# Patient Record
Sex: Male | Born: 1950 | Race: White | Hispanic: No | Marital: Married | Smoking: Former smoker
Health system: Southern US, Community
[De-identification: ages and names within clinical notes are randomized; demographics above are authoritative.]

## PROBLEM LIST (undated history)

## (undated) DIAGNOSIS — I10 Essential (primary) hypertension: Secondary | ICD-10-CM

## (undated) DIAGNOSIS — E785 Hyperlipidemia, unspecified: Secondary | ICD-10-CM

## (undated) DIAGNOSIS — R002 Palpitations: Secondary | ICD-10-CM

## (undated) HISTORY — DX: Palpitations: R00.2

## (undated) HISTORY — DX: Essential (primary) hypertension: I10

## (undated) HISTORY — DX: Hyperlipidemia, unspecified: E78.5

---

## 2013-12-01 ENCOUNTER — Encounter: Payer: Self-pay | Admitting: Internal Medicine

## 2013-12-01 ENCOUNTER — Ambulatory Visit (INDEPENDENT_AMBULATORY_CARE_PROVIDER_SITE_OTHER): Payer: BC Managed Care – PPO | Admitting: Internal Medicine

## 2013-12-01 VITALS — BP 138/78 | HR 74 | Ht 73.0 in | Wt 242.1 lb

## 2013-12-01 DIAGNOSIS — R9431 Abnormal electrocardiogram [ECG] [EKG]: Secondary | ICD-10-CM

## 2013-12-01 DIAGNOSIS — R002 Palpitations: Secondary | ICD-10-CM | POA: Insufficient documentation

## 2013-12-01 DIAGNOSIS — E785 Hyperlipidemia, unspecified: Secondary | ICD-10-CM

## 2013-12-01 DIAGNOSIS — E782 Mixed hyperlipidemia: Secondary | ICD-10-CM | POA: Insufficient documentation

## 2013-12-01 DIAGNOSIS — I10 Essential (primary) hypertension: Secondary | ICD-10-CM | POA: Insufficient documentation

## 2013-12-01 DIAGNOSIS — Z8249 Family history of ischemic heart disease and other diseases of the circulatory system: Secondary | ICD-10-CM | POA: Insufficient documentation

## 2013-12-01 DIAGNOSIS — R0609 Other forms of dyspnea: Secondary | ICD-10-CM

## 2013-12-01 NOTE — Patient Instructions (Signed)
Your physician has recommended that you have a sleep study. This test records several body functions during sleep, including: brain activity, eye movement, oxygen and carbon dioxide blood levels, heart rate and rhythm, breathing rate and rhythm, the flow of air through your mouth and nose, snoring, body muscle movements, and chest and belly movement.  Dr. Rennis Golden has ordered a coronary calcium score - this test is done at [facility].   Your physician recommends that you schedule a follow-up appointment after your tests - in a few weeks.

## 2013-12-01 NOTE — Progress Notes (Signed)
OFFICE NOTE  Chief Complaint:  Palpitations  Primary Care Physician: Paulino Rily, MD  HPI:  [name] West is a very pleasant 62 year old male who's been followed by Dr. Yetta Barre. He recently has been having more frequent palpitations and is interested in a cardiac visit to evaluate these as well as his cardiac risk. He tells me that he was initially diagnosed with abnormally high cholesterol in the 1990s. He was then started on Lipitor and has stayed on that since then. A few years ago he is noted to have onset of hypertension and has had some weight gain. He used to be an avid runner, doing about 4 miles 3 times a week. He did then developed the problems and is not exercising as much. He has had some weight gain, particularly around the neck. His wife, who is a Designer, jewellery, noted that he does snore and may actually sleep in different sides of the house. She reports that he has had some witnessed apneic events in the past, but he is never had a sleep study. With regards to his palpitations, he reports feelings of skipped beats which were interpreted as possible PVCs. While he may be having this, an EKG in the office today did catch a short run of PAT which was 3 beats. I suspect this could be the cause of his palpitations. He is not on a beta blocker. Finally his family history is significant for heart disease both in his father and grandfather. He is asymptomatic, denying any chest pain or shortness of breath with exertion.  PMHx:  Past Medical History  Diagnosis Date  . Hyperlipidemia   . Hypertension   . Palpitations     History reviewed. No pertinent past surgical history.  FAMHx:  Family History  Problem Relation Age of Onset  . Cancer Mother   . Heart disease Father     MI at 10, CABG at 38, pacemaker at 68  . Heart attack Paternal Grandfather 84  . Heart failure Maternal Grandfather 63    SOCHx:   reports that he quit smoking about 40 years ago. He has never  used smokeless tobacco. He reports that he drinks about 1.0 ounces of alcohol per week. He reports that he does not use illicit drugs.  ALLERGIES:  No Known Allergies  ROS: A comprehensive review of systems was negative except for: Cardiovascular: positive for palpitations  HOME MEDS: Current Outpatient Prescriptions  Medication Sig Dispense Refill  . atorvastatin (LIPITOR) 40 MG tablet Take 40 mg by mouth daily.      . Cholecalciferol (VITAMIN D-3) 1000 UNITS CAPS Take 1 capsule by mouth daily.      Marland Kitchen lisinopril-hydrochlorothiazide (PRINZIDE,ZESTORETIC) 20-12.5 MG per tablet Take 1 tablet by mouth daily.      . Multiple Vitamin (MULTIVITAMIN) capsule Take 1 capsule by mouth daily. With A, C, E      . Omega-3 Fatty Acids (FISH OIL) 1200 MG CAPS Take 1 capsule by mouth daily.       No current facility-administered medications for this visit.    LABS/IMAGING: No results found for this or any previous visit (from the past 48 hour(s)). No results found.  VITALS: BP 138/78  Pulse 74  Ht 6\' 1"  (1.854 m)  Wt 242 lb 1.6 oz (109.816 kg)  BMI 31.95 kg/m2  EXAM: General appearance: alert and no distress Neck: no carotid bruit and no JVD Lungs: clear to auscultation bilaterally Heart: regular rate and rhythm, S1, S2 normal, no  murmur, click, rub or gallop Abdomen: soft, non-tender; bowel sounds normal; no masses,  no organomegaly Extremities: extremities normal, atraumatic, no cyanosis or edema Pulses: 2+ and symmetric Skin: Skin color, texture, turgor normal. No rashes or lesions Neurologic: Grossly normal Psych: Mood, affect normal  EKG: Sinus rhythm with a short run of PAT  ASSESSMENT: 1. Palpitations-PAT was noted 2. Hyperlipidemia 3. Hypertension 4. Family history of premature coronary disease  PLAN: 1.   Mr. See has been having palpitations and we were fortunate enough to capture a short run of PAT on the monitor today. There is no evidence for atrial fibrillation,  and it is possible he could have PVCs or other arrhythmias at other times. At this point he is not particularly bothered by the palpitations clearly not enough to take medication for it. I do think it's a good idea for some type of a cardiovascular assessment given his strong family history of heart disease and long-standing dyslipidemia. I would recommend a coronary calcium score which could be helpful in risk stratification and if abnormal, could indicate the need to lower his cholesterol additionally. His most recent LDL cholesterol was 103, but goal LDL may be less than 70. His blood pressures been generally well controlled although slightly higher today. Finally there is a question of sleeping which is difficult sometimes for him including some snoring which has been noted by his wife. He does have body habitus risk for sleep apnea. This could be causing his palpitations. I would recommend a sleep study. Plan to see him back to discuss the studies in a few weeks. As mentioned diffuse coronary calcium score is high, we may need to intensify his statin therapy further and I would considering adding low-dose beta blocker which is indicated in patients with known coronary artery disease, and especially with palpitations and/or tachycardia arrhythmias.  Thank you again for allowing me to participate in his care.  Chrystie Nose, MD, [facility] Attending Cardiologist [facility]  Kairon Shock C 12/01/2013, 4:26 PM

## 2013-12-02 ENCOUNTER — Other Ambulatory Visit: Payer: Self-pay | Admitting: Internal Medicine

## 2013-12-02 ENCOUNTER — Encounter: Payer: Self-pay | Admitting: Internal Medicine

## 2013-12-07 ENCOUNTER — Ambulatory Visit (INDEPENDENT_AMBULATORY_CARE_PROVIDER_SITE_OTHER)
Admission: RE | Admit: 2013-12-07 | Discharge: 2013-12-07 | Disposition: A | Discharge status: Home / Self Care | Payer: Self-pay | Source: Ambulatory Visit | Attending: Internal Medicine | Admitting: Internal Medicine

## 2013-12-07 DIAGNOSIS — Z8249 Family history of ischemic heart disease and other diseases of the circulatory system: Secondary | ICD-10-CM

## 2013-12-07 DIAGNOSIS — E785 Hyperlipidemia, unspecified: Secondary | ICD-10-CM

## 2013-12-07 DIAGNOSIS — R9431 Abnormal electrocardiogram [ECG] [EKG]: Secondary | ICD-10-CM

## 2013-12-29 ENCOUNTER — Ambulatory Visit (INDEPENDENT_AMBULATORY_CARE_PROVIDER_SITE_OTHER): Payer: BC Managed Care – PPO | Admitting: Internal Medicine

## 2013-12-29 ENCOUNTER — Encounter: Payer: Self-pay | Admitting: Internal Medicine

## 2013-12-29 VITALS — BP 138/74 | HR 60 | Ht 73.0 in | Wt 229.6 lb

## 2013-12-29 DIAGNOSIS — I4949 Other premature depolarization: Secondary | ICD-10-CM

## 2013-12-29 DIAGNOSIS — Z8249 Family history of ischemic heart disease and other diseases of the circulatory system: Secondary | ICD-10-CM

## 2013-12-29 DIAGNOSIS — I493 Ventricular premature depolarization: Secondary | ICD-10-CM | POA: Insufficient documentation

## 2013-12-29 DIAGNOSIS — R9389 Abnormal findings on diagnostic imaging of other specified body structures: Secondary | ICD-10-CM

## 2013-12-29 DIAGNOSIS — G4730 Sleep apnea, unspecified: Secondary | ICD-10-CM

## 2013-12-29 DIAGNOSIS — R002 Palpitations: Secondary | ICD-10-CM

## 2013-12-29 DIAGNOSIS — R931 Abnormal findings on diagnostic imaging of heart and coronary circulation: Secondary | ICD-10-CM | POA: Insufficient documentation

## 2013-12-29 DIAGNOSIS — I10 Essential (primary) hypertension: Secondary | ICD-10-CM

## 2013-12-29 DIAGNOSIS — E785 Hyperlipidemia, unspecified: Secondary | ICD-10-CM

## 2013-12-29 MED ORDER — ZOLPIDEM TARTRATE 10 MG PO TABS: 10.0000 mg | ORAL_TABLET | Freq: Every evening | ORAL | Status: DC | PRN

## 2013-12-29 MED ORDER — METOPROLOL SUCCINATE ER 25 MG PO TB24: 25.0000 mg | ORAL_TABLET | Freq: Every day | ORAL | Status: DC

## 2013-12-29 NOTE — Patient Instructions (Signed)
Your physician has requested that you have en exercise stress myoview. For further information please visit [email or web address]. Please follow instruction sheet, as given.  Please schedule a CPAP titration at [place] 25mg  once daily START aspirin 81mg  daily.  Your physician recommends that you schedule a follow-up appointment in 1 month.

## 2013-12-29 NOTE — Progress Notes (Signed)
OFFICE NOTE  Chief Complaint:  Palpitations  Primary Care Physician: Andria Frames, MD  HPI:  [name] West is a very pleasant 63 year old male who's been followed by Dr. Ronnald Ramp. He recently has been having more frequent palpitations and is interested in a cardiac visit to evaluate these as well as his cardiac risk. He tells me that he was initially diagnosed with abnormally high cholesterol in the 1990s. He was then started on Lipitor and has stayed on that since then. A few years ago he is noted to have onset of hypertension and has had some weight gain. He used to be an avid runner, doing about 4 miles 3 times a week. He did then developed the problems and is not exercising as much. He has had some weight gain, particularly around the neck. His wife, who is a Equities trader, noted that he does snore and may actually sleep in different sides of the house. She reports that he has had some witnessed apneic events in the past, but he is never had a sleep study. With regards to his palpitations, he reports feelings of skipped beats which were interpreted as possible PVCs. While he may be having this, an EKG in the office today did catch a short run of PAT which was 3 beats. I suspect this could be the cause of his palpitations. He is not on a beta blocker. Finally his family history is significant for heart disease both in his father and grandfather. He is asymptomatic, denying any chest pain or shortness of breath with exertion.  [name] West returns today for followup on his cardiac calcium score. The findings indicated a coronary calcium score of 313, which is 93% compared to age and sex-matched controls. He is also here to followup on his sleep study which she underwent on 12/10/2013. This demonstrated severe obstructive sleep apnea. During the total sleep time of 2 hours and 15 minutes, there were 55.6 AHI.  CPAP titration was recommended.  PMHx:  Past Medical History  Diagnosis Date   . Hyperlipidemia   . Hypertension   . Palpitations     History reviewed. No pertinent past surgical history.  FAMHx:  Family History  Problem Relation Age of Onset  . Cancer Mother   . Heart disease Father     MI at 33, CABG at 1, pacemaker at 3  . Heart attack Paternal Grandfather 29  . Heart failure Maternal Grandfather 54    SOCHx:   reports that he quit smoking about 40 years ago. He has never used smokeless tobacco. He reports that he drinks about 1.0 ounces of alcohol per week. He reports that he does not use illicit drugs.  ALLERGIES:  No Known Allergies  ROS: A comprehensive review of systems was negative except for: Cardiovascular: positive for palpitations  HOME MEDS: Current Outpatient Prescriptions  Medication Sig Dispense Refill  . aspirin 81 MG tablet Take 81 mg by mouth daily.      Marland Kitchen atorvastatin (LIPITOR) 40 MG tablet Take 40 mg by mouth daily.      . Cholecalciferol (VITAMIN D-3) 1000 UNITS CAPS Take 1 capsule by mouth daily.      Marland Kitchen lisinopril-hydrochlorothiazide (PRINZIDE,ZESTORETIC) 20-12.5 MG per tablet Take 1 tablet by mouth daily.      . Multiple Vitamin (MULTIVITAMIN) capsule Take 1 capsule by mouth daily. With A, C, E      . Omega-3 Fatty Acids (FISH OIL) 1200 MG CAPS Take 1 capsule by mouth daily.      Marland Kitchen  metoprolol succinate (TOPROL-XL) 25 MG 24 hr tablet Take 1 tablet (25 mg total) by mouth daily.  30 tablet  6  . zolpidem (AMBIEN) 10 MG tablet Take 1 tablet (10 mg total) by mouth at bedtime as needed for sleep.  1 tablet  0   No current facility-administered medications for this visit.    LABS/IMAGING: No results found for this or any previous visit (from the past 48 hour(s)). No results found.  VITALS: BP 138/74  Pulse 60  Ht 6\' 1"  (1.854 m)  Wt 229 lb 9.6 oz (104.146 kg)  BMI 30.30 kg/m2  EXAM: deferred  EKG: Sinus rhythm with frequent PVC's, in a trigemenal pattern  ASSESSMENT: 1. Palpitations-PAT and PVC's in trigemeny have  been noted 2. Hyperlipidemia 3. Hypertension 4. Family history of premature coronary disease 5. Abnormal coronary calcium score of 313  PLAN: 1.   [name] West continues to have palpitations and is now noted to have PVCs in a trigeminal pattern. He's coronary calcium score is elevated although in an indeterminate range. Nevertheless he has coronary artery disease by definition and will require increased treatment. I recommended starting aspirin 81 mg daily. For his palpitations, although hasn't start Toprol-XL 25 mg daily.  In addition, I would recommend an exercise nuclear stress test to evaluate for the significance of his coronary calcium. It is possible he could have obstructive coronary disease leading to his arrhythmias.  Finally, his sleep study is significant for a structure sleep apnea which is severe. We have arranged for followup CPAP titration and provided a prescription for Ambien to take as needed to get rest during the study.  Followup will be in one month to go over the stress test results and see if he's had improvement in his palpitations and sleep.  Thank you again for allowing me to participate in his care.  Pixie Casino, MD, [facility] Attending Cardiologist [facility]  Tomma Ehinger C 12/29/2013, 5:40 PM

## 2014-01-05 ENCOUNTER — Ambulatory Visit (HOSPITAL_COMMUNITY)
Admission: RE | Admit: 2014-01-05 | Discharge: 2014-01-05 | Disposition: A | Discharge status: Home / Self Care | Payer: BC Managed Care – PPO | Source: Ambulatory Visit | Attending: Cardiovascular Disease | Admitting: Cardiovascular Disease

## 2014-01-05 DIAGNOSIS — R0989 Other specified symptoms and signs involving the circulatory and respiratory systems: Secondary | ICD-10-CM

## 2014-01-05 DIAGNOSIS — I4949 Other premature depolarization: Secondary | ICD-10-CM | POA: Insufficient documentation

## 2014-01-05 DIAGNOSIS — R931 Abnormal findings on diagnostic imaging of heart and coronary circulation: Secondary | ICD-10-CM

## 2014-01-05 DIAGNOSIS — R0609 Other forms of dyspnea: Secondary | ICD-10-CM

## 2014-01-05 DIAGNOSIS — I493 Ventricular premature depolarization: Secondary | ICD-10-CM

## 2014-01-05 DIAGNOSIS — R9431 Abnormal electrocardiogram [ECG] [EKG]: Secondary | ICD-10-CM

## 2014-01-05 MED ORDER — TECHNETIUM TC 99M SESTAMIBI GENERIC - CARDIOLITE
10.0000 | Freq: Once | INTRAVENOUS | Status: AC | PRN
  Administered 2014-01-05: 10 via INTRAVENOUS

## 2014-01-05 MED ORDER — TECHNETIUM TC 99M SESTAMIBI GENERIC - CARDIOLITE
30.0000 | Freq: Once | INTRAVENOUS | Status: AC | PRN
  Administered 2014-01-05: 30 via INTRAVENOUS

## 2014-01-05 NOTE — Procedures (Addendum)
[facility] [address] [place] [address] [phone]  Cardiology Nuclear Med Study  [name] West is a 63 y.o. male     MRN : [id number]     DOB: 1951/05/14  Procedure Date: 01/05/2014  Nuclear Med Background Indication for Stress Test:  Abnormal EKG History:  Abnormal coronary calcium score 313 Cardiac Risk Factors: Family History - CAD, History of Smoking, Hypertension, Lipids, Overweight and PVC's;PAT's;Tigiminal pattern  Symptoms:  DOE and Palpitations   Nuclear Pre-Procedure Caffeine/Decaff Intake:  1:00am NPO After: 11am   IV Site: R Hand  IV 0.9% NS with Angio Cath:  22g  Chest Size (in):  46"  IV Started by: Azucena Cecil, RN  Height: 6\' 1"  (1.854 m)  Cup Size: n/a  BMI:  Body mass index is 30.22 kg/(m^2). Weight:  229 lb (103.874 kg)   Tech Comments:  n/a    Nuclear Med Study 1 or 2 day study: 1 day  Stress Test Type:  Stress  Order Authorizing Provider:  Lyman Bishop, MD   Resting Radionuclide: Technetium 60m Sestamibi  Resting Radionuclide Dose: 10.1 mCi   Stress Radionuclide:  Technetium 48m Sestamibi  Stress Radionuclide Dose: 30.9 mCi           Stress Protocol Rest HR: 59 Stress HR: 139  Rest BP:130/78 Stress BP: 157/70  Exercise Time (min): 9:01 METS: 10.10   Predicted Max HR: 158 bpm % Max HR: 87.97 bpm Rate Pressure Product: 21823  Dose of Adenosine (mg):  n/a Dose of Lexiscan: n/a mg  Dose of Atropine (mg): n/a Dose of Dobutamine: n/a mcg/kg/min (at max HR)  Stress Test Technologist: Mellody Memos, CCT Nuclear Technologist: Otho Perl, CNMT   Rest Procedure:  Myocardial perfusion imaging was performed at rest 45 minutes following the intravenous administration of Technetium 73m Sestamibi. Stress Procedure:  The patient performed treadmill exercise using a Bruce  Protocol for 9 minutes and 1 second. The patient stopped due to chest pain, shortness of breath and fatigue. Patient  complained chest pain.  There were significant ST-T wave changes.  Technetium 15m Sestamibi was injected at peak exercise and myocardial perfusion imaging was performed after a brief delay.  Transient Ischemic Dilatation (Normal <1.22):  1.09 Lung/Heart Ratio (Normal <0.45):  0.10 QGS EDV:  122 ml QGS ESV:  66 ml LV Ejection Fraction: 46%    Rest ECG: NSR - Normal EKG  Stress ECG: Significant 1 - 2 mm of downsloping ST abnormalities consistent with ischemia with frequent ventricular ectopy and transient bigeminy.  QPS Raw Data Images:  Normal; no motion artifact; normal heart/lung ratio. Stress Images:  There is decreased uptake in the apical inferiori inferolateral wall and basal lateral and inferior segment. Rest Images:  There is decreased uptake apically and diaphragmatic attenuation in the inferior wall. Subtraction (SDS):  Focal apical scar with peri-infarction ischemia and ischemia in the basal lateral segment.  Impression Exercise Capacity:  Good exercise capacity. BP Response:  Hypotensive blood pressure response with BP decreasing to 115/67 immediately following stress. Clinical Symptoms:  Mild chest pain/dyspnea. ECG Impression:  Significant ST abnormalities consistent with ischemia. Comparison with Prior Nuclear Study: No images to compare  Overall Impression:  High risk stress nuclear study demonstrating exercise induced chest pain, ECG changes and ectopy with scintigraphic evidence for apical scar with peri-infarct ischemia and ischemia involving the basal lateral wall. Clinical correlation is recommended.  LV Wall Motion:  Mild LV dysfunction,EF 46% with apical hypocontractility.  Troy Sine, MD  01/05/2014 6:12 PM

## 2014-01-26 ENCOUNTER — Ambulatory Visit (INDEPENDENT_AMBULATORY_CARE_PROVIDER_SITE_OTHER): Payer: BC Managed Care – PPO | Admitting: Internal Medicine

## 2014-01-26 ENCOUNTER — Encounter: Payer: Self-pay | Admitting: Internal Medicine

## 2014-01-26 VITALS — BP 108/64 | HR 60 | Ht 73.0 in | Wt 231.3 lb

## 2014-01-26 DIAGNOSIS — R002 Palpitations: Secondary | ICD-10-CM

## 2014-01-26 DIAGNOSIS — R9389 Abnormal findings on diagnostic imaging of other specified body structures: Secondary | ICD-10-CM

## 2014-01-26 DIAGNOSIS — D689 Coagulation defect, unspecified: Secondary | ICD-10-CM

## 2014-01-26 DIAGNOSIS — I4949 Other premature depolarization: Secondary | ICD-10-CM

## 2014-01-26 DIAGNOSIS — R5381 Other malaise: Secondary | ICD-10-CM

## 2014-01-26 DIAGNOSIS — R5383 Other fatigue: Secondary | ICD-10-CM

## 2014-01-26 DIAGNOSIS — I10 Essential (primary) hypertension: Secondary | ICD-10-CM

## 2014-01-26 DIAGNOSIS — I493 Ventricular premature depolarization: Secondary | ICD-10-CM

## 2014-01-26 DIAGNOSIS — R9439 Abnormal result of other cardiovascular function study: Secondary | ICD-10-CM | POA: Insufficient documentation

## 2014-01-26 DIAGNOSIS — Z01818 Encounter for other preprocedural examination: Secondary | ICD-10-CM

## 2014-01-26 DIAGNOSIS — R931 Abnormal findings on diagnostic imaging of heart and coronary circulation: Secondary | ICD-10-CM

## 2014-01-26 DIAGNOSIS — Z8249 Family history of ischemic heart disease and other diseases of the circulatory system: Secondary | ICD-10-CM

## 2014-01-26 NOTE — Progress Notes (Signed)
OFFICE NOTE  Chief Complaint:  Palpitations  Primary Care Physician: Andria Frames, MD  HPI:  [name] West is a very pleasant 63 year old male who's been followed by Dr. Ronnald Ramp. He recently has been having more frequent palpitations and is interested in a cardiac visit to evaluate these as well as his cardiac risk. He tells me that he was initially diagnosed with abnormally high cholesterol in the 1990s. He was then started on Lipitor and has stayed on that since then. A few years ago he is noted to have onset of hypertension and has had some weight gain. He used to be an avid runner, doing about 4 miles 3 times a week. He did then developed the problems and is not exercising as much. He has had some weight gain, particularly around the neck. His wife, who is a Equities trader, noted that he does snore and may actually sleep in different sides of the house. She reports that he has had some witnessed apneic events in the past, but he is never had a sleep study. With regards to his palpitations, he reports feelings of skipped beats which were interpreted as possible PVCs. While he may be having this, an EKG in the office today did catch a short run of PAT which was 3 beats. I suspect this could be the cause of his palpitations. He is not on a beta blocker. Finally his family history is significant for heart disease both in his father and grandfather. He is asymptomatic, denying any chest pain or shortness of breath with exertion.  [name] West returns today for followup on his cardiac calcium score. The findings indicated a coronary calcium score of 313, which is 93% compared to age and sex-matched controls. He is also here to followup on his sleep study which she underwent on 12/10/2013. This demonstrated severe obstructive sleep apnea. During the total sleep time of 2 hours and 15 minutes, there were 55.6 AHI.  CPAP titration was recommended.  Based upon his frequent PVCs which are  persistent, recommended a nuclear stress test as there is evidence of multivessel coronary calcium. He underwent a nuclear stress test 01/05/2014. This was an exercise nuclear stress test. He exercised for 9 minutes and achieved 10 metabolic equivalents. Exercise was discontinued due to chest pain, shortness of breath and fatigue. There was 2 mm of horizontal ST segment depression in frequent ventricular ectopy and transient bigeminy noted. The nuclear perfusion images were interpreted as high wrist demonstrating an apical scar with peri-infarct ischemia and basal lateral ischemia.  EF was 46%.  I share the results with Mr. Husby today and am concerned about significant coronary disease. He is quite surprised by these findings as he reports that he feels fairly well and continues to deny any chest pain. He does report some mild fatigue and decreased exercise tolerance compared to what he can do with running and exercise several years ago.  PMHx:  Past Medical History  Diagnosis Date  . Hyperlipidemia   . Hypertension   . Palpitations     History reviewed. No pertinent past surgical history.  FAMHx:  Family History  Problem Relation Age of Onset  . Cancer Mother   . Heart disease Father     MI at 68, CABG at 12, pacemaker at 67  . Heart attack Paternal Grandfather 23  . Heart failure Maternal Grandfather 37    SOCHx:   reports that he quit smoking about 40 years ago. He has never  used smokeless tobacco. He reports that he drinks about 1.0 ounces of alcohol per week. He reports that he does not use illicit drugs.  ALLERGIES:  No Known Allergies  ROS: A comprehensive review of systems was negative except for: Cardiovascular: positive for palpitations  HOME MEDS: Current Outpatient Prescriptions  Medication Sig Dispense Refill  . aspirin 81 MG tablet Take 81 mg by mouth daily.      Marland Kitchen atorvastatin (LIPITOR) 40 MG tablet Take 40 mg by mouth daily.      . Cholecalciferol (VITAMIN  D-3) 1000 UNITS CAPS Take 1 capsule by mouth daily.      Marland Kitchen lisinopril-hydrochlorothiazide (PRINZIDE,ZESTORETIC) 20-12.5 MG per tablet Take 1 tablet by mouth daily.      . metoprolol succinate (TOPROL-XL) 25 MG 24 hr tablet Take 1 tablet (25 mg total) by mouth daily.  30 tablet  6  . Multiple Vitamin (MULTIVITAMIN) capsule Take 1 capsule by mouth daily. With A, C, E      . Omega-3 Fatty Acids (FISH OIL) 1200 MG CAPS Take 1 capsule by mouth daily.      Marland Kitchen zolpidem (AMBIEN) 10 MG tablet Take 1 tablet (10 mg total) by mouth at bedtime as needed for sleep.  1 tablet  0   No current facility-administered medications for this visit.    LABS/IMAGING: No results found for this or any previous visit (from the past 48 hour(s)). No results found.  VITALS: BP 108/64  Pulse 60  Ht 6\' 1"  (1.854 m)  Wt 231 lb 4.8 oz (104.917 kg)  BMI 30.52 kg/m2  EXAM: deferred  EKG: Sinus rhythm with frequent PVC's, in a trigemenal pattern  ASSESSMENT: 1. Palpitations-PAT and PVC's in trigemeny have been noted, minimal improvement with b-blocker 2. Hyperlipidemia 3. Hypertension 4. Family history of premature coronary disease 5. Abnormal coronary calcium score of 313 6. High risk nuclear stress test with a partially reversible inferior apical and lateral defect, EF 46%  PLAN: 1.   Mr. 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 He's coronary calcium score is elevated although in an indeterminate range. His nuclear stress test was abnormal and he developed chest pain with exercise as well as ST depression and perfusion images which indicate a partially reversible defect with a reduced EF to 46% . Based on these findings I would recommend cardiac catheterization. I discussed the risks and benefits with him and he is agreeable to proceed.  I will be in contact with the results of his cardiac catheterization within the next few weeks. Thank you again for allowing  me to participate in his care.  Pixie Casino, MD, [facility] Attending Cardiologist [facility]  HILTY,Kenneth C 01/26/2014, 3:29 PM

## 2014-01-26 NOTE — Patient Instructions (Addendum)
Your physician has requested that you have a cardiac catheterization. Cardiac catheterization is used to diagnose and/or treat various heart conditions. Doctors may recommend this procedure for a number of different reasons. The most common reason is to evaluate chest pain. Chest pain can be a symptom of coronary artery disease (CAD), and cardiac catheterization can show whether plaque is narrowing or blocking your heart's arteries. This procedure is also used to evaluate the valves, as well as measure the blood flow and oxygen levels in different parts of your heart. For further information please visit [email or web address]. Please follow instruction sheet, as given.  You will need to have blood work & a chest x-ray 3-5 days prior to this procedure.  Please go to [phone] do not need an appointment.

## 2014-01-28 ENCOUNTER — Telehealth: Payer: Self-pay | Admitting: Cardiology

## 2014-01-28 ENCOUNTER — Encounter: Payer: Self-pay | Admitting: Cardiology

## 2014-01-28 NOTE — Telephone Encounter (Signed)
Returned call and pt verified x 2.  Pt stated he had to have his cath rescheduled and it will be w/ Dr. Ellyn Hack.  Stated he has never met him and wanted to have a 5 min conversation w/ him about his views on stents and angioplasty.  Stated he wants to know if he has ever done a cath before, which he's sure he has, but wants to hear it from him directly.  Pt informed Dr. Ellyn Hack and his nurse will be notified of his request.  Pt verbalized understanding and agreed w/ plan.  Message forwarded to Dr. Sharlyn Bologna, RN.  Of note, this RN did explain to pt that Dr. Ellyn Hack is not scheduled to be back in the office until next Friday for a partial day and then again on the following Tuesday before his appt.  Pt stated that is fine, as long as he calls before the procedure.

## 2014-01-28 NOTE — Telephone Encounter (Signed)
[name] West was scheduled for a cardiac cath on 02/09/14 with Dr. Debara Pickett.  The patient called and stated that he could not have the procedure done on the 18th due to a scheduling conflict.  I spoke with Dr. Debara Pickett and he stated that this patient needed to have this procedure done sooner than later.  Dr. Debara Pickett stated that one of the other physicians could do it.  I called the patient and explained this to the patient and he stated to schedule with Dr. Ellyn Hack. Procedure is scheduled for 02/10/14 @ 7:30 am-----the patient would like to speak with Dr. Ellyn Hack prior to having his cath.

## 2014-01-29 NOTE — Telephone Encounter (Signed)
Why don't we add him to the end of my 1/4 day on Friday .  Not a good conversation to do over the phone. That way, I don't have to rush over to see him on the Am of the procedure.    Leonie Man, MD

## 2014-01-31 ENCOUNTER — Ambulatory Visit
Admission: RE | Admit: 2014-01-31 | Discharge: 2014-01-31 | Disposition: A | Discharge status: Home / Self Care | Payer: BC Managed Care – PPO | Source: Ambulatory Visit | Attending: Internal Medicine | Admitting: Internal Medicine

## 2014-01-31 ENCOUNTER — Encounter (HOSPITAL_COMMUNITY): Payer: Self-pay | Admitting: Pharmacy Technician

## 2014-01-31 DIAGNOSIS — Z01818 Encounter for other preprocedural examination: Secondary | ICD-10-CM

## 2014-01-31 LAB — CBC
HCT: 41.7 % (ref 39.0–52.0)
Hemoglobin: 14.5 g/dL (ref 13.0–17.0)
MCH: 31.2 pg (ref 26.0–34.0)
MCHC: 34.8 g/dL (ref 30.0–36.0)
MCV: 89.7 fL (ref 78.0–100.0)
PLATELETS: 235 10*3/uL (ref 150–400)
RBC: 4.65 MIL/uL (ref 4.22–5.81)
RDW: 13.8 % (ref 11.5–15.5)
WBC: 6 10*3/uL (ref 4.0–10.5)

## 2014-01-31 LAB — BASIC METABOLIC PANEL
BUN: 15 mg/dL (ref 6–23)
CALCIUM: 9.1 mg/dL (ref 8.4–10.5)
CO2: 30 mEq/L (ref 19–32)
CREATININE: 0.77 mg/dL (ref 0.50–1.35)
Chloride: 102 mEq/L (ref 96–112)
Glucose, Bld: 93 mg/dL (ref 70–99)
Potassium: 4.9 mEq/L (ref 3.5–5.3)
Sodium: 139 mEq/L (ref 135–145)

## 2014-01-31 NOTE — Telephone Encounter (Signed)
[name] West - Will you contact this patient to schedule this appointment, per Dr. Ellyn Hack (below)?  Thanks. ~ [name]. [name], BSN, RN

## 2014-02-01 ENCOUNTER — Inpatient Hospital Stay (HOSPITAL_COMMUNITY): Payer: BC Managed Care – PPO

## 2014-02-01 ENCOUNTER — Other Ambulatory Visit: Payer: Self-pay | Admitting: *Deleted

## 2014-02-01 ENCOUNTER — Inpatient Hospital Stay (HOSPITAL_COMMUNITY)
Admission: RE | Admit: 2014-02-01 | Discharge: 2014-02-07 | DRG: 234 | Disposition: A | Discharge status: Home / Self Care | Payer: BC Managed Care – PPO | Source: Ambulatory Visit | Attending: Thoracic Surgery (Cardiothoracic Vascular Surgery) | Admitting: Thoracic Surgery (Cardiothoracic Vascular Surgery)

## 2014-02-01 ENCOUNTER — Ambulatory Visit (HOSPITAL_COMMUNITY): Payer: BC Managed Care – PPO

## 2014-02-01 ENCOUNTER — Encounter (HOSPITAL_COMMUNITY): Payer: Self-pay | Admitting: Emergency Medicine

## 2014-02-01 ENCOUNTER — Encounter (HOSPITAL_COMMUNITY)
Admission: RE | Disposition: A | Discharge status: Home / Self Care | Payer: Self-pay | Source: Ambulatory Visit | Attending: Thoracic Surgery (Cardiothoracic Vascular Surgery)

## 2014-02-01 DIAGNOSIS — I498 Other specified cardiac arrhythmias: Secondary | ICD-10-CM | POA: Diagnosis not present

## 2014-02-01 DIAGNOSIS — E8779 Other fluid overload: Secondary | ICD-10-CM | POA: Diagnosis not present

## 2014-02-01 DIAGNOSIS — I2510 Atherosclerotic heart disease of native coronary artery without angina pectoris: Secondary | ICD-10-CM

## 2014-02-01 DIAGNOSIS — D62 Acute posthemorrhagic anemia: Secondary | ICD-10-CM | POA: Diagnosis not present

## 2014-02-01 DIAGNOSIS — Z01818 Encounter for other preprocedural examination: Secondary | ICD-10-CM

## 2014-02-01 DIAGNOSIS — Z8249 Family history of ischemic heart disease and other diseases of the circulatory system: Secondary | ICD-10-CM

## 2014-02-01 DIAGNOSIS — I493 Ventricular premature depolarization: Secondary | ICD-10-CM

## 2014-02-01 DIAGNOSIS — R9439 Abnormal result of other cardiovascular function study: Secondary | ICD-10-CM

## 2014-02-01 DIAGNOSIS — Z87891 Personal history of nicotine dependence: Secondary | ICD-10-CM

## 2014-02-01 DIAGNOSIS — Z01810 Encounter for preprocedural cardiovascular examination: Secondary | ICD-10-CM

## 2014-02-01 DIAGNOSIS — R002 Palpitations: Secondary | ICD-10-CM

## 2014-02-01 DIAGNOSIS — Z79899 Other long term (current) drug therapy: Secondary | ICD-10-CM

## 2014-02-01 DIAGNOSIS — D72829 Elevated white blood cell count, unspecified: Secondary | ICD-10-CM | POA: Diagnosis not present

## 2014-02-01 DIAGNOSIS — I10 Essential (primary) hypertension: Secondary | ICD-10-CM

## 2014-02-01 DIAGNOSIS — R931 Abnormal findings on diagnostic imaging of heart and coronary circulation: Secondary | ICD-10-CM

## 2014-02-01 DIAGNOSIS — G4733 Obstructive sleep apnea (adult) (pediatric): Secondary | ICD-10-CM | POA: Diagnosis present

## 2014-02-01 DIAGNOSIS — E785 Hyperlipidemia, unspecified: Secondary | ICD-10-CM | POA: Diagnosis present

## 2014-02-01 DIAGNOSIS — Z7982 Long term (current) use of aspirin: Secondary | ICD-10-CM

## 2014-02-01 HISTORY — PX: LEFT HEART CATHETERIZATION WITH CORONARY ANGIOGRAM: SHX5451

## 2014-02-01 LAB — URINALYSIS, ROUTINE W REFLEX MICROSCOPIC
BILIRUBIN URINE: NEGATIVE
Glucose, UA: NEGATIVE mg/dL
KETONES UR: NEGATIVE mg/dL
Leukocytes, UA: NEGATIVE
NITRITE: NEGATIVE
PH: 7 (ref 5.0–8.0)
Protein, ur: NEGATIVE mg/dL
Specific Gravity, Urine: 1.012 (ref 1.005–1.030)
Urobilinogen, UA: 0.2 mg/dL (ref 0.0–1.0)

## 2014-02-01 LAB — SURGICAL PCR SCREEN
MRSA, PCR: NEGATIVE
Staphylococcus aureus: POSITIVE — AB

## 2014-02-01 LAB — PULMONARY FUNCTION TEST
FEF 25-75 POST: 3.56 L/s
FEF 25-75 PRE: 3 L/s
FEF2575-%Change-Post: 18 %
FEF2575-%PRED-PRE: 95 %
FEF2575-%Pred-Post: 113 %
FEV1-%Change-Post: 4 %
FEV1-%PRED-PRE: 88 %
FEV1-%Pred-Post: 92 %
FEV1-Post: 3.62 L
FEV1-Pre: 3.48 L
FEV1FVC-%Change-Post: 5 %
FEV1FVC-%Pred-Pre: 102 %
FEV6-%Change-Post: -1 %
FEV6-%PRED-POST: 88 %
FEV6-%PRED-PRE: 89 %
FEV6-PRE: 4.47 L
FEV6-Post: 4.4 L
FEV6FVC-%CHANGE-POST: 1 %
FEV6FVC-%Pred-Post: 105 %
FEV6FVC-%Pred-Pre: 104 %
FVC-%Change-Post: -1 %
FVC-%Pred-Post: 85 %
FVC-%Pred-Pre: 86 %
FVC-POST: 4.44 L
FVC-PRE: 4.52 L
POST FEV6/FVC RATIO: 100 %
PRE FEV1/FVC RATIO: 77 %
Post FEV1/FVC ratio: 81 %
Pre FEV6/FVC Ratio: 99 %

## 2014-02-01 LAB — PROTIME-INR
INR: 0.97 (ref <1.50)
Prothrombin Time: 12.8 seconds (ref 11.6–15.2)

## 2014-02-01 LAB — APTT: APTT: 30 s (ref 24–37)

## 2014-02-01 LAB — BLOOD GAS, ARTERIAL
ACID-BASE EXCESS: 0.5 mmol/L (ref 0.0–2.0)
Bicarbonate: 24.1 mEq/L — ABNORMAL HIGH (ref 20.0–24.0)
Drawn by: 312761
FIO2: 0.21 %
O2 Saturation: 96.2 %
PCO2 ART: 35.6 mmHg (ref 35.0–45.0)
Patient temperature: 98.6
TCO2: 25.2 mmol/L (ref 0–100)
pH, Arterial: 7.445 (ref 7.350–7.450)
pO2, Arterial: 78.2 mmHg — ABNORMAL LOW (ref 80.0–100.0)

## 2014-02-01 LAB — ABO/RH: ABO/RH(D): O POS

## 2014-02-01 LAB — TYPE AND SCREEN
ABO/RH(D): O POS
ANTIBODY SCREEN: NEGATIVE

## 2014-02-01 LAB — URINE MICROSCOPIC-ADD ON

## 2014-02-01 LAB — TSH: TSH: 1.955 u[IU]/mL (ref 0.350–4.500)

## 2014-02-01 SURGERY — LEFT HEART CATHETERIZATION WITH CORONARY ANGIOGRAM
Anesthesia: LOCAL

## 2014-02-01 MED ORDER — HEPARIN (PORCINE) IN NACL 2-0.9 UNIT/ML-% IJ SOLN
INTRAMUSCULAR | Status: AC
  Filled 2014-02-01: qty 1500

## 2014-02-01 MED ORDER — ADULT MULTIVITAMIN W/MINERALS CH
1.0000 | ORAL_TABLET | Freq: Every day | ORAL | Status: DC
  Filled 2014-02-01: qty 1

## 2014-02-01 MED ORDER — SODIUM CHLORIDE 0.9 % IV SOLN: 250.0000 mL | INTRAVENOUS | Status: DC | PRN

## 2014-02-01 MED ORDER — SODIUM CHLORIDE 0.9 % IV SOLN
INTRAVENOUS | Status: DC
  Filled 2014-02-01: qty 1

## 2014-02-01 MED ORDER — DEXMEDETOMIDINE HCL IN NACL 400 MCG/100ML IV SOLN
0.1000 ug/kg/h | INTRAVENOUS | Status: DC
  Filled 2014-02-01: qty 100

## 2014-02-01 MED ORDER — PHENYLEPHRINE HCL 10 MG/ML IJ SOLN
30.0000 ug/min | INTRAVENOUS | Status: DC
  Filled 2014-02-01: qty 2

## 2014-02-01 MED ORDER — HYDROCHLOROTHIAZIDE 12.5 MG PO CAPS
12.5000 mg | ORAL_CAPSULE | Freq: Every day | ORAL | Status: DC
  Administered 2014-02-01: 12.5 mg via ORAL
  Filled 2014-02-01 (×2): qty 1

## 2014-02-01 MED ORDER — BISACODYL 5 MG PO TBEC: 5.0000 mg | DELAYED_RELEASE_TABLET | Freq: Once | ORAL | Status: DC

## 2014-02-01 MED ORDER — MAGNESIUM SULFATE 50 % IJ SOLN
40.0000 meq | INTRAMUSCULAR | Status: DC
  Filled 2014-02-01: qty 10

## 2014-02-01 MED ORDER — LISINOPRIL 20 MG PO TABS
20.0000 mg | ORAL_TABLET | Freq: Every day | ORAL | Status: DC
  Administered 2014-02-01: 20 mg via ORAL
  Filled 2014-02-01 (×2): qty 1

## 2014-02-01 MED ORDER — SODIUM CHLORIDE 0.9 % IV SOLN
INTRAVENOUS | Status: DC
  Administered 2014-02-01: 10:00:00 via INTRAVENOUS

## 2014-02-01 MED ORDER — SODIUM CHLORIDE 0.9 % IJ SOLN
3.0000 mL | INTRAMUSCULAR | Status: DC | PRN
Start: 2014-02-01 — End: 2014-02-02

## 2014-02-01 MED ORDER — SODIUM CHLORIDE 0.9 % IJ SOLN
3.0000 mL | INTRAMUSCULAR | Status: DC | PRN
Start: 2014-02-01 — End: 2014-02-01

## 2014-02-01 MED ORDER — MUPIROCIN 2 % EX OINT
1.0000 "application " | TOPICAL_OINTMENT | Freq: Two times a day (BID) | CUTANEOUS | Status: AC
  Administered 2014-02-01 – 2014-02-06 (×9): 1 via NASAL
  Filled 2014-02-01 (×2): qty 22

## 2014-02-01 MED ORDER — SODIUM CHLORIDE 0.9 % IV SOLN
INTRAVENOUS | Status: DC
  Filled 2014-02-01: qty 30

## 2014-02-01 MED ORDER — VANCOMYCIN HCL 10 G IV SOLR
1500.0000 mg | INTRAVENOUS | Status: DC
  Filled 2014-02-01: qty 1500

## 2014-02-01 MED ORDER — CHLORHEXIDINE GLUCONATE CLOTH 2 % EX PADS
6.0000 | MEDICATED_PAD | Freq: Once | CUTANEOUS | Status: AC
  Administered 2014-02-02: 6 via TOPICAL

## 2014-02-01 MED ORDER — FENTANYL CITRATE 0.05 MG/ML IJ SOLN
INTRAMUSCULAR | Status: AC
  Filled 2014-02-01: qty 2

## 2014-02-01 MED ORDER — ASPIRIN 81 MG PO CHEW
81.0000 mg | CHEWABLE_TABLET | ORAL | Status: AC
  Administered 2014-02-01: 81 mg via ORAL
  Filled 2014-02-01: qty 1

## 2014-02-01 MED ORDER — NITROGLYCERIN 0.2 MG/ML ON CALL CATH LAB
INTRAVENOUS | Status: AC
Start: 2014-02-01 — End: 2014-02-01
  Filled 2014-02-01: qty 1

## 2014-02-01 MED ORDER — TEMAZEPAM 15 MG PO CAPS: 15.0000 mg | ORAL_CAPSULE | Freq: Once | ORAL | Status: AC | PRN

## 2014-02-01 MED ORDER — HEPARIN SODIUM (PORCINE) 1000 UNIT/ML IJ SOLN
INTRAMUSCULAR | Status: AC
  Filled 2014-02-01: qty 1

## 2014-02-01 MED ORDER — PLASMA-LYTE 148 IV SOLN
INTRAVENOUS | Status: DC
  Filled 2014-02-01: qty 2.5

## 2014-02-01 MED ORDER — SODIUM CHLORIDE 0.9 % IV SOLN: 1.0000 mL/kg/h | INTRAVENOUS | Status: AC

## 2014-02-01 MED ORDER — HEPARIN (PORCINE) IN NACL 100-0.45 UNIT/ML-% IJ SOLN
1250.0000 [IU]/h | INTRAMUSCULAR | Status: DC
  Administered 2014-02-01: 1250 [IU]/h via INTRAVENOUS
  Filled 2014-02-01 (×2): qty 250

## 2014-02-01 MED ORDER — ALPRAZOLAM 0.25 MG PO TABS: 0.2500 mg | ORAL_TABLET | ORAL | Status: DC | PRN

## 2014-02-01 MED ORDER — MULTIVITAMINS PO CAPS
1.0000 | ORAL_CAPSULE | Freq: Every day | ORAL | Status: DC
Start: 2014-02-01 — End: 2014-02-01

## 2014-02-01 MED ORDER — ASPIRIN 81 MG PO TABS: 81.0000 mg | ORAL_TABLET | Freq: Every day | ORAL | Status: DC

## 2014-02-01 MED ORDER — ASPIRIN EC 81 MG PO TBEC
81.0000 mg | DELAYED_RELEASE_TABLET | Freq: Every day | ORAL | Status: DC
  Filled 2014-02-01: qty 1

## 2014-02-01 MED ORDER — LISINOPRIL-HYDROCHLOROTHIAZIDE 20-12.5 MG PO TABS: 1.0000 | ORAL_TABLET | Freq: Every day | ORAL | Status: DC

## 2014-02-01 MED ORDER — CHLORHEXIDINE GLUCONATE CLOTH 2 % EX PADS
6.0000 | MEDICATED_PAD | Freq: Once | CUTANEOUS | Status: AC
  Administered 2014-02-01: 6 via TOPICAL

## 2014-02-01 MED ORDER — DEXTROSE 5 % IV SOLN
750.0000 mg | INTRAVENOUS | Status: DC
  Filled 2014-02-01 (×2): qty 750

## 2014-02-01 MED ORDER — DEXTROSE 5 % IV SOLN
1.5000 g | INTRAVENOUS | Status: DC
  Filled 2014-02-01: qty 1.5

## 2014-02-01 MED ORDER — DOPAMINE-DEXTROSE 3.2-5 MG/ML-% IV SOLN
2.0000 ug/kg/min | INTRAVENOUS | Status: DC
  Filled 2014-02-01: qty 250

## 2014-02-01 MED ORDER — LIDOCAINE HCL (PF) 1 % IJ SOLN
INTRAMUSCULAR | Status: AC
  Filled 2014-02-01: qty 30

## 2014-02-01 MED ORDER — VERAPAMIL HCL 2.5 MG/ML IV SOLN
INTRAVENOUS | Status: AC
  Filled 2014-02-01: qty 2

## 2014-02-01 MED ORDER — MIDAZOLAM HCL 2 MG/2ML IJ SOLN
INTRAMUSCULAR | Status: AC
  Filled 2014-02-01: qty 2

## 2014-02-01 MED ORDER — ONDANSETRON HCL 4 MG/2ML IJ SOLN: 4.0000 mg | Freq: Four times a day (QID) | INTRAMUSCULAR | Status: DC | PRN

## 2014-02-01 MED ORDER — POTASSIUM CHLORIDE 2 MEQ/ML IV SOLN
80.0000 meq | INTRAVENOUS | Status: DC
  Filled 2014-02-01: qty 40

## 2014-02-01 MED ORDER — NITROGLYCERIN IN D5W 200-5 MCG/ML-% IV SOLN
2.0000 ug/min | INTRAVENOUS | Status: DC
  Filled 2014-02-01: qty 250

## 2014-02-01 MED ORDER — CHLORHEXIDINE GLUCONATE CLOTH 2 % EX PADS: 6.0000 | MEDICATED_PAD | Freq: Every day | CUTANEOUS | Status: DC

## 2014-02-01 MED ORDER — ACETAMINOPHEN 325 MG PO TABS: 650.0000 mg | ORAL_TABLET | ORAL | Status: DC | PRN

## 2014-02-01 MED ORDER — EPINEPHRINE HCL 1 MG/ML IJ SOLN
0.5000 ug/min | INTRAVENOUS | Status: DC
  Filled 2014-02-01: qty 4

## 2014-02-01 MED ORDER — SODIUM CHLORIDE 0.9 % IV SOLN
INTRAVENOUS | Status: DC
  Filled 2014-02-01: qty 40

## 2014-02-01 MED ORDER — DIAZEPAM 5 MG PO TABS
5.0000 mg | ORAL_TABLET | Freq: Once | ORAL | Status: AC
  Administered 2014-02-02: 5 mg via ORAL
  Filled 2014-02-01: qty 1

## 2014-02-01 MED ORDER — ATORVASTATIN CALCIUM 40 MG PO TABS
40.0000 mg | ORAL_TABLET | Freq: Every day | ORAL | Status: DC
  Administered 2014-02-01 – 2014-02-06 (×5): 40 mg via ORAL
  Filled 2014-02-01 (×7): qty 1

## 2014-02-01 MED ORDER — ALBUTEROL SULFATE (2.5 MG/3ML) 0.083% IN NEBU
2.5000 mg | INHALATION_SOLUTION | Freq: Once | RESPIRATORY_TRACT | Status: AC
  Administered 2014-02-01: 2.5 mg via RESPIRATORY_TRACT

## 2014-02-01 MED ORDER — METOPROLOL SUCCINATE ER 25 MG PO TB24
25.0000 mg | ORAL_TABLET | Freq: Every day | ORAL | Status: DC
Start: 2014-02-01 — End: 2014-02-02
  Administered 2014-02-01: 25 mg via ORAL
  Filled 2014-02-01 (×2): qty 1

## 2014-02-01 MED ORDER — METOPROLOL TARTRATE 12.5 MG HALF TABLET
12.5000 mg | ORAL_TABLET | Freq: Once | ORAL | Status: DC
  Filled 2014-02-01: qty 1

## 2014-02-01 MED ORDER — SODIUM CHLORIDE 0.9 % IJ SOLN
3.0000 mL | Freq: Two times a day (BID) | INTRAMUSCULAR | Status: DC
  Administered 2014-02-01: 3 mL via INTRAVENOUS

## 2014-02-01 NOTE — Progress Notes (Addendum)
VASCULAR LAB PRELIMINARY  PRELIMINARY  PRELIMINARY  PRELIMINARY  Pre-op Cardiac Surgery  Carotid Findings:  Bilateral:  1-39% ICA stenosis.  Vertebral artery flow is antegrade.     Upper Extremity Right Left  Brachial Pressures Radial cath access Triphasic 123 Triphasic  Radial Waveforms Triphasic Triphasic  Ulnar Waveforms Triphasic Triphasic  Palmar Arch (Allen's Test) Abnormal Abnormal   Findings: Doppler waveforms remained normal with radial compression and reversed with ulnar compression on the right. Left Doppler waveforms diminished greater than 50% with radial compression and remained normal with ulnar compression  Palpable pedal pulses bilaterally  BIGGS,SANDRA, RVT 02/01/2014, 2:59 PM   Lavonne Kinderman, [place], RVS 02/01/2014, 2:59 PM

## 2014-02-01 NOTE — H&P (Addendum)
     INTERVAL PROCEDURE H&P  History and Physical Interval Note:  02/01/2014 9:42 AM  (see my office note in Epic for complete details)  [name] West has presented today for their planned procedure. The various methods of treatment have been discussed with the patient and family. After consideration of risks, benefits and other options for treatment, the patient has consented to the procedure.  The patients' outpatient history has been reviewed, patient examined, and no change in status from most recent office note within the past 30 days. I have reviewed the patients' chart and labs and will proceed as planned. Questions were answered to the patient's satisfaction.   Cath Lab Visit (complete for each Cath Lab visit)  Clinical Evaluation Leading to the Procedure:   ACS: no  Non-ACS:    Anginal Classification: No Symptoms  Anti-ischemic medical therapy: Minimal Therapy (1 class of medications)  Non-Invasive Test Results: High-risk stress test findings: cardiac mortality >3%/year  Prior CABG: No previous CABG  [name] Casino, MD, [facility] Attending Cardiologist [facility]  HILTY,Kenneth C 02/01/2014, 9:42 AM

## 2014-02-01 NOTE — Progress Notes (Signed)
Pt states he will wait until after surgery to wear CPAP mask for the first time. Pt encouraged to call RT if pt changes his mind tonight. No distress noted.

## 2014-02-01 NOTE — CV Procedure (Signed)
CARDIAC CATHETERIZATION REPORT  [name] West   [id number] Mar 14, 1951  Performing Cardiologist: Pixie Casino Primary Physician: Andria Frames, MD Primary Cardiologist:  Ashauna Bertholf  Procedures Performed:  Left Heart Catheterization via 5 Fr right radial artery access  Left Ventriculography, (RAO/LAO) 15 ml/sec for 30 ml total contrast  Native Coronary Angiography  Indication(s): fatigue, irregular heart beat and palpitations  Pre-Procedural Diagnosis(es):  1. Frequent PVC's 2. Fatigue 3. High risk treadmill nuclear stress test, EF 46%, suggestive of ischemia  Post-Procedural Diagnosis(es): 1. Multivessel CAD with collaterals  Pre-Procedural Non-invasive testing: No ischemia by perfusion, however, there was a fixed apical defect and EF was 46%  History: 63 y.o. male who's been followed by Dr. Ronnald Ramp. He recently has been having more frequent palpitations and is interested in a cardiac visit to evaluate these as well as his cardiac risk. He tells me that he was initially diagnosed with abnormally high cholesterol in the 1990s. He was then started on Lipitor and has stayed on that since then. A few years ago he is noted to have onset of hypertension and has had some weight gain. He used to be an avid runner, doing about 4 miles 3 times a week. He did then developed the problems and is not exercising as much. He has had some weight gain, particularly around the neck. His wife, who is a Equities trader, noted that he does snore and may actually sleep in different sides of the house. She reports that he has had some witnessed apneic events in the past, but he is never had a sleep study. With regards to his palpitations, he reports feelings of skipped beats which were interpreted as possible PVCs. While he may be having this, an EKG in the office today did catch a short run of PAT which was 3 beats. I suspect this could be the cause of his palpitations. He is not on a beta blocker.  Finally his family history is significant for heart disease both in his father and grandfather. He is asymptomatic, denying any chest pain or shortness of breath with exertion, therefore I recommended a coronary calcium score for risk stratification. The findings indicated a coronary calcium score of 313, which is 93% compared to age and sex-matched controls. He is also here to followup on his sleep study which she underwent on 12/10/2013. This demonstrated severe obstructive sleep apnea. During the total sleep time of 2 hours and 15 minutes, there were 55.6 AHI. CPAP titration was recommended. Based upon his frequent PVCs which are persistent, recommended a nuclear stress test as there is evidence of multivessel coronary calcium. He underwent a nuclear stress test 01/05/2014. This was an exercise nuclear stress test. He exercised for 9 minutes and achieved 10 metabolic equivalents. Exercise was discontinued due to chest pain, shortness of breath and fatigue. There was 2 mm of horizontal ST segment depression in frequent ventricular ectopy and transient bigeminy noted. The nuclear perfusion images were interpreted as high wrist demonstrating an apical scar with peri-infarct ischemia and basal lateral ischemia. EF was 46%. I shared the results with Mr. Fillingim today and am concerned about significant coronary disease. He is quite surprised by these findings as he reports that he feels fairly well and continues to deny any chest pain. He does report some mild fatigue and decreased exercise tolerance compared to what he can do with running and exercise several years ago. Based on these abnormal findings, I have recommended heart catheterization.  Risks / Complications include, but  not limited to: Death, MI, CVA/TIA, VF/VT (with defibrillation), Bradycardia (need for temporary pacer placement), contrast induced nephropathy, bleeding / bruising / hematoma / pseudoaneurysm, vascular or coronary injury (with possible  emergent CT or Vascular Surgery), adverse medication reactions, infection.    Consent: Risks of procedure as well as the alternatives and risks of each were explained to the (patient/caregiver).  Consent for procedure obtained.  Procedure: The patient was brought to the 2nd [place] [facility] Catheter[facility] Lab in the fasting state and prepped and draped in the usual sterile fashion for (Right radial) access. A modified Allen's test with plethysmography was performed on the right wrist demonstrating adequate Ulnar Artery collateral flow.    Time Out: Verified patient identification, verified procedure, site/side was marked, verified correct patient position, special equipment/implants available, radiation safety measures in place (including badges and shielding), medications/allergies/relevent history reviewed, required imaging and test results available.  Performed  Procedure: The right wrist was anesthetized with 1% subcutaneous Lidocaine.  The right radial artery was accessed using the Seldinger Technique with placement of a 6 Fr Glide Sheath. The sheath was aspirated and flushed.  Then a total of 10 ml of standard Radial Artery Cocktail (see medications) was infused.  A 5 Fr TIG 4.0 Catheter was advanced of over a Safety J wire into the ascending Aorta.  The catheter was used to engage the left coronary artery.  Multiple cineangiographic views of the left coronary artery system(s) were performed. This catheter was then exchanged over the [employer] J wire for a JR4 catheter. This was used to engage and take multiple images of the right coronary system. This catheter was then exchanged over a wire for an angled Pigtail catheter that was advanced across the Aortic Valve.  LV hemodynamics were measured (and) Left Ventriculography was performed.  LV hemodynamics were then re-sampled, and the catheter was pulled back across the Aortic Valve for measurement of "pull-back" gradient.  The  catheter and the wire were removed completely out of the body.  The sheath was removed in the Cath Lab with a TR band placed at 12 ml Air at 11:30 am (time).  Reverse Allen's test did  reveal non-occlusive hemostasis.  Recovery: The patient was transported to the cath lab holding area in stable condition.   The patient  was stable before, during and following the procedure.   Patient did tolerate procedure well. There were not complications.  EBL: Minimal  Medications:  Premedication: none  Sedation:  2 mg IV Versed, 50 mcg IV Fentanyl  Contrast:  85 ml Omnipaque  Local Anesthesia: 3 cc 1% lidocaine  5000 U IV heparin  10 cc Radial cocktail  Hemodynamics:  Central Aortic Pressure / Mean Aortic Pressure: 102/62  LV Pressure / LV End diastolic Pressure:  12  Left Ventriculography:  EF:  60-65%  Wall Motion: Mild apical hypokinesis  MR: 0  Coronary Angiographic Data:  Left Main:  Heavily calcified - there is moderate to severe distal LM stenosis. Bifurcates into the LAD and LCx in the usual fashion.  Left Anterior Descending (LAD):  This is proximally occluded. Distal filling via collaterals from the RCA is noted.  1st diagonal (D1):  Small, moderate to severe ostial stenosis  2nd diagonal (D2):  Small, diffusely diseased  3rd diagonal (D3):  Small, diffusely diseased  Circumflex (LCx):  Large vessel which is subtotally (99%) occluded in the ostial/proximal segment and there is severe mid-vessel disease as well. Collaterals from the PDA are noted.  1st obtuse marginal:  Larger vessel with moderate stenosis.  Right Coronary Artery: Large, ectatic appearing vessel which is dominant. There is 70-80% mid-distal stenosis prior to the PLB/PDA bifurcation.  right ventricle branch of right coronary artery: no stenosis  posterior descending artery: There is severe proximal stenosis - collaterals are seen distally  posterior lateral branch:  Mild diffuse  disease  Impression: 1.  Severe LM and multivessel CAD. Occluded LAD, subtotally occluded proximal LCX and severe distal RCA disease with collaterals. 2.  LVEF 60-65% with a mild anteroapical WMA. 3.  LVEDP = 12 mmHg.  Plan: 1.  Would recommend admission to stepdown and starting anticoagulation on heparin due to severity of CAD. 2.  CT surgery consult for CABG recommendations. 3.  Continue current anti-ischemic outpatient medications  The case and results was discussed with the patient and family if available.  The case and results was not discussed with the patient's PCP. The case and results was discussed with the patient's Cardiologist.  Time Spent Directly with the Patient:  60 minutes  Pixie Casino, MD, [facility] Attending Cardiologist [facility]  Christella App C 02/01/2014, 2:50 PM

## 2014-02-01 NOTE — Progress Notes (Signed)
ANTICOAGULATION CONSULT NOTE - Initial Consult  Pharmacy Consult for heparin Indication: chest pain/ACS  No Known Allergies  Patient Measurements: Height: 6\' 1"  (185.4 cm) Weight: 231 lb (104.781 kg) IBW/kg (Calculated) : 79.9 Heparin Dosing Weight: 88 kg  Vital Signs: Temp: 98 F (36.7 C) (02/10 0858) Temp src: Oral (02/10 0858) BP: 132/78 mmHg (02/10 0858) Pulse Rate: 61 (02/10 1012)  Labs:  Recent Labs  01/31/14 1416  HGB 14.5  HCT 41.7  PLT 235  APTT 30  LABPROT 12.8  INR 0.97  CREATININE 0.77    Estimated Creatinine Clearance: 121.7 ml/min (by C-G formula based on Cr of 0.77).   Medical History: Past Medical History  Diagnosis Date  . Hyperlipidemia   . Hypertension   . Palpitations     Medications:  Prescriptions prior to admission  Medication Sig Dispense Refill  . aspirin 81 MG tablet Take 81 mg by mouth daily.      Marland Kitchen atorvastatin (LIPITOR) 40 MG tablet Take 40 mg by mouth daily.      . Cholecalciferol (VITAMIN D-3) 1000 UNITS CAPS Take 1 capsule by mouth daily.      Marland Kitchen lisinopril-hydrochlorothiazide (PRINZIDE,ZESTORETIC) 20-12.5 MG per tablet Take 1 tablet by mouth daily.      . metoprolol succinate (TOPROL-XL) 25 MG 24 hr tablet Take 1 tablet (25 mg total) by mouth daily.  30 tablet  6  . Multiple Vitamin (MULTIVITAMIN) capsule Take 1 capsule by mouth daily. With A, C, E      . Omega-3 Fatty Acids (FISH OIL) 1200 MG CAPS Take 1 capsule by mouth daily.      Marland Kitchen zolpidem (AMBIEN) 10 MG tablet Take 1 tablet (10 mg total) by mouth at bedtime as needed for sleep.  1 tablet  0    Assessment: 63 yo man to start heparin 8 hours after sheath pulled.  Baseline Hg 14.5, PTLC 235 Goal of Therapy:  Heparin level 0.3-0.7 units/ml Monitor platelets by anticoagulation protocol: Yes   Plan:  Start heparin at 1250 units/hr 8 hours after sheath pulled. Check heparin level 8 hours after start. Daily HL and CBC while on heparin.  Chalon Zobrist  [place] 02/01/2014,3:58 PM

## 2014-02-01 NOTE — Consult Note (Signed)
Reason for Consult:Left main and 3 vessel CAD Referring Physician: Dr. Ronny Flurry is an 63 y.o. male.  HPI:  63 yo with a chief complaint of SOB with exertion and palpitations.  Mr. Schlarb is a 63 yo man with no prior cardiac history. He does have a history of hyperlipidemia and hypertension as well as a strong family history of CAD. He is a Corporate treasurer. He is very active. He jogged on a regular basis until a few years ago when he had to stop due to joint issues. He continues to walk 4 miles several times a week. He noticed that he was getting tired more easily asn was having more frequent palpitations when he would check his pulse. He was not having any chest pain. He went ot Dr. Ronnald Ramp and then Dr. Debara Pickett for evaluation. Evaluation revealed sleep apnea and an elevated cardiac calcium score.  An exercise nuclear stress test was done on 01/05/2014. He exercised for 9 minutes and achieved 10 metabolic equivalents. Exercise was discontinued due to chest pain, shortness of breath and fatigue. There was 2 mm ST depression, frequent ventricular ectopy and transient bigeminy noted. The nuclear perfusion images were interpreted as high risk sowing an apical scar with peri-infarct ischemia and basal lateral ischemia. EF was 46%.   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 The LAD filled distally via collaterals. He was admitted and started on a heparin gtt. He is currently comfortable and without complaint.    Past Medical History  Diagnosis Date  . Hyperlipidemia   . Hypertension   . Palpitations     No past surgical history on file.  Family History  Problem Relation Age of Onset  . Cancer Mother   . Heart disease Father     MI at 80, CABG at 61, pacemaker at 24  . Heart attack Paternal Grandfather  43  . Heart failure Maternal Grandfather 60    Social History:  reports that he quit smoking about 40 years ago. He has never used smokeless tobacco. He reports that he drinks about 1.0 ounces of alcohol per week. He reports that he does not use illicit drugs.  Allergies: No Known Allergies  Medications:  Prior to Admission:  Prescriptions prior to admission  Medication Sig Dispense Refill  . aspirin 81 MG tablet Take 81 mg by mouth daily.      Marland Kitchen atorvastatin (LIPITOR) 40 MG tablet Take 40 mg by mouth daily.      . Cholecalciferol (VITAMIN D-3) 1000 UNITS CAPS Take 1 capsule by mouth daily.      Marland Kitchen lisinopril-hydrochlorothiazide (PRINZIDE,ZESTORETIC) 20-12.5 MG per tablet Take 1 tablet by mouth daily.      . metoprolol succinate (TOPROL-XL) 25 MG 24 hr tablet Take 1 tablet (25 mg total) by mouth daily.  30 tablet  6  . Multiple Vitamin (MULTIVITAMIN) capsule Take 1 capsule by mouth daily. With A, C, E      . Omega-3 Fatty Acids (FISH OIL) 1200 MG CAPS Take 1 capsule by mouth daily.      Marland Kitchen zolpidem (AMBIEN) 10 MG tablet Take 1 tablet (10 mg total) by mouth at bedtime as needed for sleep.  1 tablet  0    Results for orders placed during the hospital encounter of 02/01/14 (from the past  48 hour(s))  SURGICAL PCR SCREEN     Status: Abnormal   Collection Time    02/01/14  3:52 PM      Result Value Range   MRSA, PCR NEGATIVE  NEGATIVE   Staphylococcus aureus POSITIVE (*) NEGATIVE   Comment:            The Xpert SA Assay (FDA     approved for NASAL specimens     in patients over 67 years of age),     is one component of     a comprehensive surveillance     program.  Test performance has     been validated by [employer] for patients greater     than or equal to 72 year old.     It is not intended     to diagnose infection nor to     guide or monitor treatment.    Dg Chest 2 View  01/31/2014   CLINICAL DATA:  Preop evaluation.  EXAM: CHEST  2 VIEW  COMPARISON:  CT CARDIAC  SCORING dated 12/07/2013  FINDINGS: The heart size and mediastinal contours are within normal limits. Both lungs are clear. The visualized skeletal structures are unremarkable.  IMPRESSION: No active cardiopulmonary disease.   Electronically Signed   By: Margaree Mackintosh M.D.   On: 01/31/2014 13:59    Review of Systems  Constitutional: Positive for malaise/fatigue. Negative for fever, chills and weight loss.  Respiratory: Positive for shortness of breath. Negative for cough and wheezing.   Cardiovascular: Positive for chest pain (only during stress test) and palpitations. Negative for claudication and leg swelling.  All other systems reviewed and are negative.   Blood pressure 119/71, pulse 59, temperature 98.5 F (36.9 C), temperature source Oral, resp. rate 16, height 6\' 1"  (1.854 m), weight 227 lb 11.8 oz (103.3 kg), SpO2 95.00%. Physical Exam  Vitals reviewed. Constitutional: He is oriented to person, place, and time. He appears well-developed and well-nourished. No distress.  HENT:  Head: Normocephalic and atraumatic.  Eyes: Pupils are equal, round, and reactive to light.  Neck: Neck supple. No thyromegaly present.  No carotid bruits  Cardiovascular: Normal rate, regular rhythm, normal heart sounds and intact distal pulses.  Exam reveals no gallop and no friction rub.   No murmur heard. Respiratory: Effort normal and breath sounds normal. He has no wheezes. He has no rales.  GI: Soft. There is no tenderness.  Musculoskeletal: He exhibits edema.  Lymphadenopathy:    He has no cervical adenopathy.  Neurological: He is alert and oriented to person, place, and time. No cranial nerve deficit.  Skin: Skin is warm and dry.   Cardiac Catheterization Hemodynamics:  Central Aortic Pressure / Mean Aortic Pressure: 102/62  LV Pressure / LV End diastolic Pressure: 12  Left Ventriculography:  EF: 60-65%  Wall Motion: Mild apical hypokinesis  MR: 0  Coronary Angiographic Data:  Left Main:  Heavily calcified - there is moderate to severe distal LM stenosis. Bifurcates into the LAD and LCx in the usual fashion.  Left Anterior Descending (LAD): This is proximally occluded. Distal filling via collaterals from the RCA is noted.  1st diagonal (D1): Small, moderate to severe ostial stenosis  2nd diagonal (D2): Small, diffusely diseased  3rd diagonal (D3): Small, diffusely diseased  Circumflex (LCx): Large vessel which is subtotally (99%) occluded in the ostial/proximal segment and there is severe mid-vessel disease as well. Collaterals from the PDA are noted.  1st obtuse marginal:  Larger vessel with moderate stenosis.  Right Coronary Artery: Large, ectatic appearing vessel which is dominant. There is 70-80% mid-distal stenosis prior to the PLB/PDA bifurcation.  right ventricle branch of right coronary artery: no stenosis  posterior descending artery: There is severe proximal stenosis - collaterals are seen distally  posterior lateral branch: Mild diffuse disease Impression:  1. Severe LM and multivessel CAD. Occluded LAD, subtotally occluded proximal LCX and severe distal RCA disease with collaterals.  2. LVEF 60-65% with a mild anteroapical WMA.  3. LVEDP = 12 mmHg.  Plan:  1. Would recommend admission to stepdown and starting anticoagulation on heparin due to severity of CAD.  2. CT surgery consult for CABG recommendations.  3. 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 CABG is indicated for survival benefit and relief of symptoms.   I have discussed with Mr and Mrs Pasqual the general nature of the procedure, the need for general anesthesia, and the incisions to be used. We discussed the expected hospital stay, overall recovery and short and long term outcomes. We discussed the indications, risks, benefits and alternatives(medical therapy). They understand the risks include, but are not limited to death,  stroke, MI, DVT/PE, bleeding, possible need for transfusion, infections,other organ system dysfunction including respiratory, renal, or GI complications. He accepts the risks and agrees to proceed.  Carotids and PFts OK, labs OK  Plan CABG tomorrow 1st case  Jarian Longoria C 02/01/2014, 6:00 PM

## 2014-02-02 ENCOUNTER — Inpatient Hospital Stay (HOSPITAL_COMMUNITY): Payer: BC Managed Care – PPO

## 2014-02-02 ENCOUNTER — Encounter (HOSPITAL_COMMUNITY): Payer: BC Managed Care – PPO | Admitting: Anesthesiology

## 2014-02-02 ENCOUNTER — Inpatient Hospital Stay (HOSPITAL_COMMUNITY): Payer: BC Managed Care – PPO | Admitting: Anesthesiology

## 2014-02-02 ENCOUNTER — Encounter (HOSPITAL_COMMUNITY)
Admission: RE | Disposition: A | Discharge status: Home / Self Care | Payer: BC Managed Care – PPO | Source: Ambulatory Visit | Attending: Thoracic Surgery (Cardiothoracic Vascular Surgery)

## 2014-02-02 ENCOUNTER — Encounter (HOSPITAL_COMMUNITY): Payer: Self-pay | Admitting: Anesthesiology

## 2014-02-02 DIAGNOSIS — I2510 Atherosclerotic heart disease of native coronary artery without angina pectoris: Secondary | ICD-10-CM

## 2014-02-02 HISTORY — PX: INTRAOPERATIVE TRANSESOPHAGEAL ECHOCARDIOGRAM: SHX5062

## 2014-02-02 HISTORY — PX: CORONARY ARTERY BYPASS GRAFT: SHX141

## 2014-02-02 LAB — BASIC METABOLIC PANEL
BUN: 13 mg/dL (ref 6–23)
CO2: 25 mEq/L (ref 19–32)
Calcium: 9.4 mg/dL (ref 8.4–10.5)
Chloride: 103 mEq/L (ref 96–112)
Creatinine, Ser: 0.94 mg/dL (ref 0.50–1.35)
GFR calc Af Amer: >90 mL/min (ref >90)
GFR, EST NON AFRICAN AMERICAN: 88 mL/min — AB (ref >90)
Glucose, Bld: 100 mg/dL — ABNORMAL HIGH (ref 70–99)
POTASSIUM: 4.8 meq/L (ref 3.7–5.3)
SODIUM: 141 meq/L (ref 137–147)

## 2014-02-02 LAB — POCT I-STAT 4, (NA,K, GLUC, HGB,HCT)
GLUCOSE: 147 mg/dL — AB (ref 70–99)
GLUCOSE: 144 mg/dL — AB (ref 70–99)
Glucose, Bld: 116 mg/dL — ABNORMAL HIGH (ref 70–99)
Glucose, Bld: 136 mg/dL — ABNORMAL HIGH (ref 70–99)
Glucose, Bld: 128 mg/dL — ABNORMAL HIGH (ref 70–99)
Glucose, Bld: 156 mg/dL — ABNORMAL HIGH (ref 70–99)
Glucose, Bld: 116 mg/dL — ABNORMAL HIGH (ref 70–99)
HCT: 38 % — ABNORMAL LOW (ref 39.0–52.0)
HCT: 32 % — ABNORMAL LOW (ref 39.0–52.0)
HCT: 33 % — ABNORMAL LOW (ref 39.0–52.0)
HEMATOCRIT: 36 % — AB (ref 39.0–52.0)
HEMATOCRIT: 29 % — AB (ref 39.0–52.0)
HEMATOCRIT: 32 % — AB (ref 39.0–52.0)
HEMATOCRIT: 36 % — AB (ref 39.0–52.0)
HEMOGLOBIN: 9.9 g/dL — AB (ref 13.0–17.0)
HEMOGLOBIN: 10.9 g/dL — AB (ref 13.0–17.0)
HEMOGLOBIN: 10.9 g/dL — AB (ref 13.0–17.0)
Hemoglobin: 12.9 g/dL — ABNORMAL LOW (ref 13.0–17.0)
Hemoglobin: 12.2 g/dL — ABNORMAL LOW (ref 13.0–17.0)
Hemoglobin: 11.2 g/dL — ABNORMAL LOW (ref 13.0–17.0)
Hemoglobin: 12.2 g/dL — ABNORMAL LOW (ref 13.0–17.0)
POTASSIUM: 3.9 meq/L (ref 3.7–5.3)
POTASSIUM: 4.5 meq/L (ref 3.7–5.3)
Potassium: 4 mEq/L (ref 3.7–5.3)
Potassium: 4.2 mEq/L (ref 3.7–5.3)
Potassium: 4.6 mEq/L (ref 3.7–5.3)
Potassium: 4.8 mEq/L (ref 3.7–5.3)
Potassium: 3.8 mEq/L (ref 3.7–5.3)
SODIUM: 140 meq/L (ref 137–147)
SODIUM: 137 meq/L (ref 137–147)
Sodium: 140 mEq/L (ref 137–147)
Sodium: 136 mEq/L — ABNORMAL LOW (ref 137–147)
Sodium: 139 mEq/L (ref 137–147)
Sodium: 139 mEq/L (ref 137–147)
Sodium: 142 mEq/L (ref 137–147)

## 2014-02-02 LAB — POCT I-STAT 3, ART BLOOD GAS (G3+)
ACID-BASE DEFICIT: 2 mmol/L (ref 0.0–2.0)
ACID-BASE DEFICIT: 3 mmol/L — AB (ref 0.0–2.0)
ACID-BASE DEFICIT: 2 mmol/L (ref 0.0–2.0)
Acid-Base Excess: 1 mmol/L (ref 0.0–2.0)
Acid-base deficit: 2 mmol/L (ref 0.0–2.0)
BICARBONATE: 24.8 meq/L — AB (ref 20.0–24.0)
BICARBONATE: 23.4 meq/L (ref 20.0–24.0)
Bicarbonate: 25.9 mEq/L — ABNORMAL HIGH (ref 20.0–24.0)
Bicarbonate: 24.4 mEq/L — ABNORMAL HIGH (ref 20.0–24.0)
Bicarbonate: 23.9 mEq/L (ref 20.0–24.0)
Bicarbonate: 24.6 mEq/L — ABNORMAL HIGH (ref 20.0–24.0)
O2 Saturation: 100 %
O2 Saturation: 97 %
O2 Saturation: 96 %
O2 Saturation: 97 %
O2 Saturation: 96 %
O2 Saturation: 97 %
PCO2 ART: 42 mmHg (ref 35.0–45.0)
PCO2 ART: 38 mmHg (ref 35.0–45.0)
PH ART: 7.398 (ref 7.350–7.450)
PH ART: 7.415 (ref 7.350–7.450)
PH ART: 7.304 — AB (ref 7.350–7.450)
PO2 ART: 87 mmHg (ref 80.0–100.0)
PO2 ART: 81 mmHg (ref 80.0–100.0)
PO2 ART: 97 mmHg (ref 80.0–100.0)
Patient temperature: 36
Patient temperature: 36.9
Patient temperature: 37.8
TCO2: 27 mmol/L (ref 0–100)
TCO2: 26 mmol/L (ref 0–100)
TCO2: 25 mmol/L (ref 0–100)
TCO2: 26 mmol/L (ref 0–100)
TCO2: 26 mmol/L (ref 0–100)
TCO2: 25 mmol/L (ref 0–100)
pCO2 arterial: 42.5 mmHg (ref 35.0–45.0)
pCO2 arterial: 53.1 mmHg — ABNORMAL HIGH (ref 35.0–45.0)
pCO2 arterial: 50.4 mmHg — ABNORMAL HIGH (ref 35.0–45.0)
pCO2 arterial: 43.1 mmHg (ref 35.0–45.0)
pH, Arterial: 7.352 (ref 7.350–7.450)
pH, Arterial: 7.273 — ABNORMAL LOW (ref 7.350–7.450)
pH, Arterial: 7.342 — ABNORMAL LOW (ref 7.350–7.450)
pO2, Arterial: 316 mmHg — ABNORMAL HIGH (ref 80.0–100.0)
pO2, Arterial: 102 mmHg — ABNORMAL HIGH (ref 80.0–100.0)
pO2, Arterial: 95 mmHg (ref 80.0–100.0)

## 2014-02-02 LAB — GLUCOSE, CAPILLARY
Glucose-Capillary: 102 mg/dL — ABNORMAL HIGH (ref 70–99)
Glucose-Capillary: 94 mg/dL (ref 70–99)
Glucose-Capillary: 104 mg/dL — ABNORMAL HIGH (ref 70–99)

## 2014-02-02 LAB — CBC
HCT: 37.1 % — ABNORMAL LOW (ref 39.0–52.0)
HCT: 35.7 % — ABNORMAL LOW (ref 39.0–52.0)
HEMATOCRIT: 42.3 % (ref 39.0–52.0)
Hemoglobin: 14.4 g/dL (ref 13.0–17.0)
Hemoglobin: 13.1 g/dL (ref 13.0–17.0)
Hemoglobin: 12.4 g/dL — ABNORMAL LOW (ref 13.0–17.0)
MCH: 30.8 pg (ref 26.0–34.0)
MCH: 31.5 pg (ref 26.0–34.0)
MCH: 31.5 pg (ref 26.0–34.0)
MCHC: 34 g/dL (ref 30.0–36.0)
MCHC: 35.3 g/dL (ref 30.0–36.0)
MCHC: 34.7 g/dL (ref 30.0–36.0)
MCV: 90.4 fL (ref 78.0–100.0)
MCV: 89.2 fL (ref 78.0–100.0)
MCV: 90.6 fL (ref 78.0–100.0)
Platelets: 215 10*3/uL (ref 150–400)
Platelets: 126 10*3/uL — ABNORMAL LOW (ref 150–400)
Platelets: 158 10*3/uL (ref 150–400)
RBC: 4.68 MIL/uL (ref 4.22–5.81)
RBC: 4.16 MIL/uL — ABNORMAL LOW (ref 4.22–5.81)
RBC: 3.94 MIL/uL — AB (ref 4.22–5.81)
RDW: 13.1 % (ref 11.5–15.5)
RDW: 13 % (ref 11.5–15.5)
RDW: 13.1 % (ref 11.5–15.5)
WBC: 9.4 10*3/uL (ref 4.0–10.5)
WBC: 13 10*3/uL — AB (ref 4.0–10.5)
WBC: 13 10*3/uL — AB (ref 4.0–10.5)

## 2014-02-02 LAB — PROTIME-INR
INR: 1.37 (ref 0.00–1.49)
PROTHROMBIN TIME: 16.5 s — AB (ref 11.6–15.2)

## 2014-02-02 LAB — POCT I-STAT, CHEM 8
BUN: 9 mg/dL (ref 6–23)
CALCIUM ION: 1.14 mmol/L (ref 1.13–1.30)
CHLORIDE: 105 meq/L (ref 96–112)
Creatinine, Ser: 0.9 mg/dL (ref 0.50–1.35)
Glucose, Bld: 103 mg/dL — ABNORMAL HIGH (ref 70–99)
HCT: 36 % — ABNORMAL LOW (ref 39.0–52.0)
Hemoglobin: 12.2 g/dL — ABNORMAL LOW (ref 13.0–17.0)
Potassium: 4.3 mEq/L (ref 3.7–5.3)
SODIUM: 142 meq/L (ref 137–147)
TCO2: 23 mmol/L (ref 0–100)

## 2014-02-02 LAB — PLATELET COUNT: Platelets: 152 10*3/uL (ref 150–400)

## 2014-02-02 LAB — HEPARIN LEVEL (UNFRACTIONATED): HEPARIN UNFRACTIONATED: 0.2 [IU]/mL — AB (ref 0.30–0.70)

## 2014-02-02 LAB — CREATININE, SERUM: Creatinine, Ser: 0.83 mg/dL (ref 0.50–1.35)

## 2014-02-02 LAB — APTT: aPTT: 28 seconds (ref 24–37)

## 2014-02-02 LAB — HEMOGLOBIN AND HEMATOCRIT, BLOOD
HCT: 30.7 % — ABNORMAL LOW (ref 39.0–52.0)
Hemoglobin: 11 g/dL — ABNORMAL LOW (ref 13.0–17.0)

## 2014-02-02 LAB — MAGNESIUM: MAGNESIUM: 3 mg/dL — AB (ref 1.5–2.5)

## 2014-02-02 LAB — POCT I-STAT GLUCOSE
GLUCOSE: 127 mg/dL — AB (ref 70–99)
Operator id: 282221

## 2014-02-02 SURGERY — CORONARY ARTERY BYPASS GRAFTING (CABG)
Anesthesia: General | Site: Chest

## 2014-02-02 MED ORDER — FAMOTIDINE IN NACL 20-0.9 MG/50ML-% IV SOLN
20.0000 mg | Freq: Two times a day (BID) | INTRAVENOUS | Status: AC
  Administered 2014-02-02: 20 mg via INTRAVENOUS

## 2014-02-02 MED ORDER — PROTAMINE SULFATE 10 MG/ML IV SOLN
INTRAVENOUS | Status: AC
  Filled 2014-02-02: qty 5

## 2014-02-02 MED ORDER — INSULIN REGULAR HUMAN 100 UNIT/ML IJ SOLN
100.0000 [IU] | INTRAMUSCULAR | Status: DC | PRN
  Administered 2014-02-02: 1.7 [IU]/h via INTRAVENOUS

## 2014-02-02 MED ORDER — MUPIROCIN 2 % EX OINT
1.0000 "application " | TOPICAL_OINTMENT | Freq: Two times a day (BID) | CUTANEOUS | Status: DC
  Filled 2014-02-02: qty 22

## 2014-02-02 MED ORDER — MIDAZOLAM HCL 10 MG/2ML IJ SOLN
INTRAMUSCULAR | Status: AC
  Filled 2014-02-02: qty 2

## 2014-02-02 MED ORDER — STERILE WATER FOR INJECTION IJ SOLN
INTRAMUSCULAR | Status: AC
  Filled 2014-02-02: qty 10

## 2014-02-02 MED ORDER — PROPOFOL 10 MG/ML IV BOLUS
INTRAVENOUS | Status: AC
  Filled 2014-02-02: qty 20

## 2014-02-02 MED ORDER — DEXTROSE 5 % IV SOLN
1.5000 g | Freq: Two times a day (BID) | INTRAVENOUS | Status: AC
  Administered 2014-02-02 – 2014-02-04 (×4): 1.5 g via INTRAVENOUS
  Filled 2014-02-02 (×4): qty 1.5

## 2014-02-02 MED ORDER — CHLORHEXIDINE GLUCONATE CLOTH 2 % EX PADS
6.0000 | MEDICATED_PAD | Freq: Every day | CUTANEOUS | Status: DC
  Administered 2014-02-02 – 2014-02-04 (×2): 6 via TOPICAL

## 2014-02-02 MED ORDER — SODIUM CHLORIDE 0.9 % IJ SOLN
3.0000 mL | Freq: Two times a day (BID) | INTRAMUSCULAR | Status: DC
Start: 2014-02-03 — End: 2014-02-04
  Administered 2014-02-03 – 2014-02-04 (×3): 3 mL via INTRAVENOUS

## 2014-02-02 MED ORDER — ASPIRIN 81 MG PO CHEW
324.0000 mg | CHEWABLE_TABLET | Freq: Every day | ORAL | Status: DC
  Administered 2014-02-04: 324 mg
  Filled 2014-02-02: qty 4

## 2014-02-02 MED ORDER — PHENYLEPHRINE 40 MCG/ML (10ML) SYRINGE FOR IV PUSH (FOR BLOOD PRESSURE SUPPORT)
PREFILLED_SYRINGE | INTRAVENOUS | Status: AC
  Filled 2014-02-02: qty 10

## 2014-02-02 MED ORDER — ALBUMIN HUMAN 5 % IV SOLN
250.0000 mL | INTRAVENOUS | Status: AC | PRN
  Administered 2014-02-02 (×4): 250 mL via INTRAVENOUS
  Filled 2014-02-02 (×2): qty 250

## 2014-02-02 MED ORDER — AMINOCAPROIC ACID 250 MG/ML IV SOLN
INTRAVENOUS | Status: DC
  Filled 2014-02-02: qty 20

## 2014-02-02 MED ORDER — MIDAZOLAM HCL 5 MG/5ML IJ SOLN
INTRAMUSCULAR | Status: DC | PRN
  Administered 2014-02-02: 1 mg via INTRAVENOUS
  Administered 2014-02-02: 2 mg via INTRAVENOUS
  Administered 2014-02-02: 3 mg via INTRAVENOUS
  Administered 2014-02-02: 2 mg via INTRAVENOUS
  Administered 2014-02-02: 3 mg via INTRAVENOUS
  Administered 2014-02-02: 2 mg via INTRAVENOUS
  Administered 2014-02-02: 1 mg via INTRAVENOUS
  Administered 2014-02-02: 2 mg via INTRAVENOUS
  Administered 2014-02-02: 1 mg via INTRAVENOUS
  Administered 2014-02-02: 3 mg via INTRAVENOUS

## 2014-02-02 MED ORDER — HEPARIN SODIUM (PORCINE) 1000 UNIT/ML IJ SOLN
INTRAMUSCULAR | Status: DC | PRN
  Administered 2014-02-02: 28000 [IU] via INTRAVENOUS
  Administered 2014-02-02: 2000 [IU] via INTRAVENOUS

## 2014-02-02 MED ORDER — DEXMEDETOMIDINE HCL IN NACL 200 MCG/50ML IV SOLN
0.1000 ug/kg/h | INTRAVENOUS | Status: DC
  Filled 2014-02-02: qty 50

## 2014-02-02 MED ORDER — PLASMA-LYTE 148 IV SOLN
INTRAVENOUS | Status: DC | PRN
  Administered 2014-02-02: 08:00:00 via INTRAVASCULAR

## 2014-02-02 MED ORDER — PHENYLEPHRINE HCL 10 MG/ML IJ SOLN
INTRAMUSCULAR | Status: AC
  Filled 2014-02-02: qty 1

## 2014-02-02 MED ORDER — DEXMEDETOMIDINE HCL IN NACL 200 MCG/50ML IV SOLN
0.1000 ug/kg/h | INTRAVENOUS | Status: DC
  Administered 2014-02-02: 0.5 ug/kg/h via INTRAVENOUS

## 2014-02-02 MED ORDER — DEXMEDETOMIDINE HCL IN NACL 200 MCG/50ML IV SOLN
INTRAVENOUS | Status: DC | PRN
  Administered 2014-02-02: .3 ug/kg/h via INTRAVENOUS

## 2014-02-02 MED ORDER — FENTANYL CITRATE 0.05 MG/ML IJ SOLN
INTRAMUSCULAR | Status: DC | PRN
  Administered 2014-02-02: 250 ug via INTRAVENOUS
  Administered 2014-02-02: 650 ug via INTRAVENOUS
  Administered 2014-02-02: 150 ug via INTRAVENOUS
  Administered 2014-02-02: 250 ug via INTRAVENOUS
  Administered 2014-02-02: 100 ug via INTRAVENOUS
  Administered 2014-02-02 (×2): 50 ug via INTRAVENOUS
  Administered 2014-02-02: 250 ug via INTRAVENOUS

## 2014-02-02 MED ORDER — ROCURONIUM BROMIDE 50 MG/5ML IV SOLN
INTRAVENOUS | Status: AC
  Filled 2014-02-02: qty 1

## 2014-02-02 MED ORDER — POTASSIUM CHLORIDE 10 MEQ/50ML IV SOLN
10.0000 meq | INTRAVENOUS | Status: AC
  Administered 2014-02-02 (×3): 10 meq via INTRAVENOUS

## 2014-02-02 MED ORDER — LACTATED RINGERS IV SOLN: 500.0000 mL | Freq: Once | INTRAVENOUS | Status: AC | PRN

## 2014-02-02 MED ORDER — SUCCINYLCHOLINE CHLORIDE 20 MG/ML IJ SOLN
INTRAMUSCULAR | Status: AC
  Filled 2014-02-02: qty 1

## 2014-02-02 MED ORDER — MORPHINE SULFATE 2 MG/ML IJ SOLN
1.0000 mg | INTRAMUSCULAR | Status: AC | PRN
  Administered 2014-02-02: 1 mg via INTRAVENOUS
  Filled 2014-02-02: qty 1

## 2014-02-02 MED ORDER — INSULIN REGULAR BOLUS VIA INFUSION
0.0000 [IU] | Freq: Three times a day (TID) | INTRAVENOUS | Status: DC
  Filled 2014-02-02: qty 10

## 2014-02-02 MED ORDER — HEPARIN SODIUM (PORCINE) 1000 UNIT/ML IJ SOLN
INTRAMUSCULAR | Status: AC
  Filled 2014-02-02: qty 1

## 2014-02-02 MED ORDER — PROTAMINE SULFATE 10 MG/ML IV SOLN
INTRAVENOUS | Status: AC
  Filled 2014-02-02: qty 25

## 2014-02-02 MED ORDER — FENTANYL CITRATE 0.05 MG/ML IJ SOLN
INTRAMUSCULAR | Status: AC
  Filled 2014-02-02: qty 5

## 2014-02-02 MED ORDER — DOCUSATE SODIUM 100 MG PO CAPS
200.0000 mg | ORAL_CAPSULE | Freq: Every day | ORAL | Status: DC
  Administered 2014-02-03 – 2014-02-06 (×4): 200 mg via ORAL
  Filled 2014-02-02 (×5): qty 2

## 2014-02-02 MED ORDER — LACTATED RINGERS IV SOLN
INTRAVENOUS | Status: DC | PRN
  Administered 2014-02-02: 08:00:00 via INTRAVENOUS

## 2014-02-02 MED ORDER — PANTOPRAZOLE SODIUM 40 MG PO TBEC
40.0000 mg | DELAYED_RELEASE_TABLET | Freq: Every day | ORAL | Status: DC
  Administered 2014-02-04 – 2014-02-07 (×4): 40 mg via ORAL
  Filled 2014-02-02 (×4): qty 1

## 2014-02-02 MED ORDER — BISACODYL 10 MG RE SUPP: 10.0000 mg | Freq: Every day | RECTAL | Status: DC

## 2014-02-02 MED ORDER — VANCOMYCIN HCL IN DEXTROSE 1-5 GM/200ML-% IV SOLN
1000.0000 mg | Freq: Once | INTRAVENOUS | Status: AC
  Administered 2014-02-02: 1000 mg via INTRAVENOUS
  Filled 2014-02-02: qty 200

## 2014-02-02 MED ORDER — MORPHINE SULFATE 2 MG/ML IJ SOLN
2.0000 mg | INTRAMUSCULAR | Status: DC | PRN
  Administered 2014-02-02 – 2014-02-03 (×3): 2 mg via INTRAVENOUS
  Filled 2014-02-02 (×3): qty 1

## 2014-02-02 MED ORDER — ACETAMINOPHEN 160 MG/5ML PO SOLN: 650.0000 mg | Freq: Once | ORAL | Status: AC

## 2014-02-02 MED ORDER — PHENYLEPHRINE HCL 10 MG/ML IJ SOLN
20.0000 mg | INTRAVENOUS | Status: DC | PRN
  Administered 2014-02-02: 15 ug/min via INTRAVENOUS

## 2014-02-02 MED ORDER — VANCOMYCIN HCL 1000 MG IV SOLR
1000.0000 mg | INTRAVENOUS | Status: DC | PRN
  Administered 2014-02-02: 1500 mg via INTRAVENOUS

## 2014-02-02 MED ORDER — EPHEDRINE SULFATE 50 MG/ML IJ SOLN
INTRAMUSCULAR | Status: DC | PRN
  Administered 2014-02-02 (×2): 10 mg via INTRAVENOUS
  Administered 2014-02-02: 5 mg via INTRAVENOUS

## 2014-02-02 MED ORDER — NITROGLYCERIN IN D5W 200-5 MCG/ML-% IV SOLN: 0.0000 ug/min | INTRAVENOUS | Status: DC

## 2014-02-02 MED ORDER — METOPROLOL TARTRATE 25 MG/10 ML ORAL SUSPENSION
12.5000 mg | Freq: Two times a day (BID) | ORAL | Status: DC
  Filled 2014-02-02 (×3): qty 5

## 2014-02-02 MED ORDER — VECURONIUM BROMIDE 10 MG IV SOLR
INTRAVENOUS | Status: DC | PRN
  Administered 2014-02-02 (×4): 10 mg via INTRAVENOUS

## 2014-02-02 MED ORDER — OXYCODONE HCL 5 MG PO TABS
5.0000 mg | ORAL_TABLET | ORAL | Status: DC | PRN
  Administered 2014-02-03 – 2014-02-07 (×15): 10 mg via ORAL
  Filled 2014-02-02 (×15): qty 2

## 2014-02-02 MED ORDER — VECURONIUM BROMIDE 10 MG IV SOLR
INTRAVENOUS | Status: AC
  Filled 2014-02-02: qty 20

## 2014-02-02 MED ORDER — MAGNESIUM SULFATE 4000MG/100ML IJ SOLN
4.0000 g | Freq: Once | INTRAMUSCULAR | Status: AC
  Administered 2014-02-02: 4 g via INTRAVENOUS

## 2014-02-02 MED ORDER — ARTIFICIAL TEARS OP OINT
TOPICAL_OINTMENT | OPHTHALMIC | Status: AC
  Filled 2014-02-02: qty 3.5

## 2014-02-02 MED ORDER — METOPROLOL TARTRATE 1 MG/ML IV SOLN: 2.5000 mg | INTRAVENOUS | Status: DC | PRN

## 2014-02-02 MED ORDER — ASPIRIN EC 325 MG PO TBEC
325.0000 mg | DELAYED_RELEASE_TABLET | Freq: Every day | ORAL | Status: DC
  Administered 2014-02-03 – 2014-02-07 (×4): 325 mg via ORAL
  Filled 2014-02-02 (×5): qty 1

## 2014-02-02 MED ORDER — NITROGLYCERIN IN D5W 200-5 MCG/ML-% IV SOLN
INTRAVENOUS | Status: DC | PRN
  Administered 2014-02-02: 5 ug/min via INTRAVENOUS

## 2014-02-02 MED ORDER — ROCURONIUM BROMIDE 100 MG/10ML IV SOLN
INTRAVENOUS | Status: DC | PRN
  Administered 2014-02-02: 50 mg via INTRAVENOUS

## 2014-02-02 MED ORDER — ARTIFICIAL TEARS OP OINT
TOPICAL_OINTMENT | OPHTHALMIC | Status: DC | PRN
  Administered 2014-02-02: 1 via OPHTHALMIC

## 2014-02-02 MED ORDER — ONDANSETRON HCL 4 MG/2ML IJ SOLN: 4.0000 mg | Freq: Four times a day (QID) | INTRAMUSCULAR | Status: DC | PRN

## 2014-02-02 MED ORDER — ACETAMINOPHEN 160 MG/5ML PO SOLN
1000.0000 mg | Freq: Four times a day (QID) | ORAL | Status: DC
  Filled 2014-02-02: qty 40

## 2014-02-02 MED ORDER — MIDAZOLAM HCL 2 MG/2ML IJ SOLN: 2.0000 mg | INTRAMUSCULAR | Status: DC | PRN

## 2014-02-02 MED ORDER — SODIUM CHLORIDE 0.45 % IV SOLN
INTRAVENOUS | Status: DC
  Administered 2014-02-02: 20 mL/h via INTRAVENOUS

## 2014-02-02 MED ORDER — EPHEDRINE SULFATE 50 MG/ML IJ SOLN
INTRAMUSCULAR | Status: AC
  Filled 2014-02-02: qty 1

## 2014-02-02 MED ORDER — PROPOFOL 10 MG/ML IV BOLUS
INTRAVENOUS | Status: DC | PRN
  Administered 2014-02-02: 50 mg via INTRAVENOUS

## 2014-02-02 MED ORDER — METOPROLOL TARTRATE 12.5 MG HALF TABLET
12.5000 mg | ORAL_TABLET | Freq: Two times a day (BID) | ORAL | Status: DC
  Filled 2014-02-02 (×3): qty 1

## 2014-02-02 MED ORDER — ACETAMINOPHEN 500 MG PO TABS
1000.0000 mg | ORAL_TABLET | Freq: Four times a day (QID) | ORAL | Status: DC
  Administered 2014-02-02 – 2014-02-07 (×17): 1000 mg via ORAL
  Filled 2014-02-02 (×22): qty 2

## 2014-02-02 MED ORDER — HEMOSTATIC AGENTS (NO CHARGE) OPTIME
TOPICAL | Status: DC | PRN
  Administered 2014-02-02: 1 via TOPICAL

## 2014-02-02 MED ORDER — 0.9 % SODIUM CHLORIDE (POUR BTL) OPTIME
TOPICAL | Status: DC | PRN
  Administered 2014-02-02: 1000 mL

## 2014-02-02 MED ORDER — GLYCOPYRROLATE 0.2 MG/ML IJ SOLN
INTRAMUSCULAR | Status: AC
  Filled 2014-02-02: qty 1

## 2014-02-02 MED ORDER — GLYCOPYRROLATE 0.2 MG/ML IJ SOLN
INTRAMUSCULAR | Status: DC | PRN
  Administered 2014-02-02: 0.2 mg via INTRAVENOUS

## 2014-02-02 MED ORDER — ALBUTEROL SULFATE HFA 108 (90 BASE) MCG/ACT IN AERS
INHALATION_SPRAY | RESPIRATORY_TRACT | Status: AC
  Filled 2014-02-02: qty 6.7

## 2014-02-02 MED ORDER — LACTATED RINGERS IV SOLN
INTRAVENOUS | Status: DC | PRN
  Administered 2014-02-02 (×2): via INTRAVENOUS

## 2014-02-02 MED ORDER — CEFUROXIME SODIUM 1.5 G IJ SOLR
1.5000 g | INTRAMUSCULAR | Status: DC | PRN
  Administered 2014-02-02: .75 g via INTRAVENOUS
  Administered 2014-02-02: 1.5 g via INTRAVENOUS

## 2014-02-02 MED ORDER — VECURONIUM BROMIDE 10 MG IV SOLR
INTRAVENOUS | Status: AC
  Filled 2014-02-02: qty 10

## 2014-02-02 MED ORDER — SODIUM CHLORIDE 0.9 % IV SOLN: 250.0000 mL | INTRAVENOUS | Status: DC

## 2014-02-02 MED ORDER — SODIUM CHLORIDE 0.9 % IV SOLN
INTRAVENOUS | Status: DC
  Filled 2014-02-02: qty 1

## 2014-02-02 MED ORDER — PROTAMINE SULFATE 10 MG/ML IV SOLN
INTRAVENOUS | Status: DC | PRN
  Administered 2014-02-02: 60 mg via INTRAVENOUS
  Administered 2014-02-02 (×2): 40 mg via INTRAVENOUS
  Administered 2014-02-02: 20 mg via INTRAVENOUS
  Administered 2014-02-02: 100 mg via INTRAVENOUS
  Administered 2014-02-02: 30 mg via INTRAVENOUS
  Administered 2014-02-02: 60 mg via INTRAVENOUS

## 2014-02-02 MED ORDER — PHENYLEPHRINE HCL 10 MG/ML IJ SOLN
0.0000 ug/min | INTRAMUSCULAR | Status: DC
  Administered 2014-02-03: 55 ug/min via INTRAVENOUS
  Filled 2014-02-02 (×2): qty 2

## 2014-02-02 MED ORDER — LACTATED RINGERS IV SOLN
INTRAVENOUS | Status: DC
  Administered 2014-02-02: 20 mL/h via INTRAVENOUS

## 2014-02-02 MED ORDER — BISACODYL 5 MG PO TBEC
10.0000 mg | DELAYED_RELEASE_TABLET | Freq: Every day | ORAL | Status: DC
  Administered 2014-02-03 – 2014-02-06 (×4): 10 mg via ORAL
  Filled 2014-02-02 (×4): qty 2

## 2014-02-02 MED ORDER — SODIUM CHLORIDE 0.9 % IV SOLN
10.0000 g | INTRAVENOUS | Status: DC | PRN
  Administered 2014-02-02: 13:00:00 via INTRAVENOUS
  Administered 2014-02-02: 5 g/h via INTRAVENOUS

## 2014-02-02 MED ORDER — SODIUM CHLORIDE 0.9 % IV SOLN: INTRAVENOUS | Status: DC

## 2014-02-02 MED ORDER — ACETAMINOPHEN 650 MG RE SUPP
650.0000 mg | Freq: Once | RECTAL | Status: AC
  Administered 2014-02-02: 650 mg via RECTAL

## 2014-02-02 MED ORDER — STERILE WATER FOR INJECTION IJ SOLN
INTRAMUSCULAR | Status: AC
  Filled 2014-02-02: qty 20

## 2014-02-02 MED ORDER — SODIUM CHLORIDE 0.9 % IJ SOLN
3.0000 mL | INTRAMUSCULAR | Status: DC | PRN
  Administered 2014-02-03: 3 mL via INTRAVENOUS

## 2014-02-02 SURGICAL SUPPLY — 99 items
ADH SKN CLS APL DERMABOND .7 (GAUZE/BANDAGES/DRESSINGS) ×2
ATTRACTOMAT 16X20 MAGNETIC DRP (DRAPES) ×4 IMPLANT
BAG DECANTER FOR FLEXI CONT (MISCELLANEOUS) ×4 IMPLANT
BANDAGE ELASTIC 4 VELCRO ST LF (GAUZE/BANDAGES/DRESSINGS) ×4 IMPLANT
BANDAGE ELASTIC 6 VELCRO ST LF (GAUZE/BANDAGES/DRESSINGS) ×4 IMPLANT
BANDAGE GAUZE ELAST BULKY 4 IN (GAUZE/BANDAGES/DRESSINGS) ×4 IMPLANT
BASKET HEART  (ORDER IN 25'S) (MISCELLANEOUS) ×1
BASKET HEART (ORDER IN 25'S) (MISCELLANEOUS) ×1
BASKET HEART (ORDER IN 25S) (MISCELLANEOUS) ×2 IMPLANT
BLADE STERNUM SYSTEM 6 (BLADE) ×4 IMPLANT
BLADE SURG 11 STRL SS (BLADE) ×4 IMPLANT
BNDG GAUZE ELAST 4 BULKY (GAUZE/BANDAGES/DRESSINGS) ×4 IMPLANT
CANISTER SUCTION 2500CC (MISCELLANEOUS) ×4 IMPLANT
CANNULA EZ GLIDE AORTIC 21FR (CANNULA) ×4 IMPLANT
CARDIAC SUCTION (MISCELLANEOUS) ×4 IMPLANT
CATH CPB KIT HENDRICKSON (MISCELLANEOUS) ×4 IMPLANT
CATH ROBINSON RED A/P 18FR (CATHETERS) ×4 IMPLANT
CATH THORACIC 36FR (CATHETERS) ×4 IMPLANT
CATH THORACIC 36FR RT ANG (CATHETERS) ×4 IMPLANT
CLIP FOGARTY SPRING 6M (CLIP) ×4 IMPLANT
CLIP TI MEDIUM 24 (CLIP) IMPLANT
CLIP TI WIDE RED SMALL 24 (CLIP) ×24 IMPLANT
COVER SURGICAL LIGHT HANDLE (MISCELLANEOUS) ×4 IMPLANT
CRADLE DONUT ADULT HEAD (MISCELLANEOUS) ×4 IMPLANT
DERMABOND ADVANCED (GAUZE/BANDAGES/DRESSINGS) ×2
DERMABOND ADVANCED .7 DNX12 (GAUZE/BANDAGES/DRESSINGS) ×2 IMPLANT
DRAPE CARDIOVASCULAR INCISE (DRAPES) ×4
DRAPE SLUSH/WARMER DISC (DRAPES) ×4 IMPLANT
DRAPE SRG 135X102X78XABS (DRAPES) ×2 IMPLANT
DRSG COVADERM 4X14 (GAUZE/BANDAGES/DRESSINGS) ×4 IMPLANT
ELECT REM PT RETURN 9FT ADLT (ELECTROSURGICAL) ×8
ELECTRODE REM PT RTRN 9FT ADLT (ELECTROSURGICAL) ×4 IMPLANT
GLOVE BIO SURGEON STRL SZ 6.5 (GLOVE) ×9 IMPLANT
GLOVE BIO SURGEON STRL SZ7 (GLOVE) ×4 IMPLANT
GLOVE BIO SURGEON STRL SZ8 (GLOVE) ×8 IMPLANT
GLOVE BIO SURGEONS STRL SZ 6.5 (GLOVE) ×3
GLOVE BIOGEL PI IND STRL 6 (GLOVE) ×8 IMPLANT
GLOVE BIOGEL PI IND STRL 6.5 (GLOVE) ×10 IMPLANT
GLOVE BIOGEL PI IND STRL 7.0 (GLOVE) ×16 IMPLANT
GLOVE BIOGEL PI INDICATOR 6 (GLOVE) ×8
GLOVE BIOGEL PI INDICATOR 6.5 (GLOVE) ×10
GLOVE BIOGEL PI INDICATOR 7.0 (GLOVE) ×16
GLOVE EUDERMIC 7 POWDERFREE (GLOVE) ×12 IMPLANT
GOWN PREVENTION PLUS XLARGE (GOWN DISPOSABLE) ×8 IMPLANT
GOWN STRL NON-REIN LRG LVL3 (GOWN DISPOSABLE) ×24 IMPLANT
HEMOSTAT POWDER SURGIFOAM 1G (HEMOSTASIS) ×12 IMPLANT
HEMOSTAT SURGICEL 2X14 (HEMOSTASIS) ×4 IMPLANT
INSERT FOGARTY XLG (MISCELLANEOUS) IMPLANT
KIT BASIN OR (CUSTOM PROCEDURE TRAY) ×4 IMPLANT
KIT ROOM TURNOVER OR (KITS) ×4 IMPLANT
KIT SUCTION CATH 14FR (SUCTIONS) ×8 IMPLANT
KIT VASOVIEW W/TROCAR VH 2000 (KITS) ×4 IMPLANT
MARKER GRAFT CORONARY BYPASS (MISCELLANEOUS) ×12 IMPLANT
MATRIX HEMOSTAT SURGIFLO (HEMOSTASIS) ×4 IMPLANT
NS IRRIG 1000ML POUR BTL (IV SOLUTION) ×20 IMPLANT
PACK OPEN HEART (CUSTOM PROCEDURE TRAY) ×4 IMPLANT
PAD ARMBOARD 7.5X6 YLW CONV (MISCELLANEOUS) ×8 IMPLANT
PAD ELECT DEFIB RADIOL ZOLL (MISCELLANEOUS) ×4 IMPLANT
PENCIL BUTTON HOLSTER BLD 10FT (ELECTRODE) ×8 IMPLANT
PUNCH AORTIC ROTATE 4.0MM (MISCELLANEOUS) ×4 IMPLANT
PUNCH AORTIC ROTATE 4.5MM 8IN (MISCELLANEOUS) IMPLANT
PUNCH AORTIC ROTATE 5MM 8IN (MISCELLANEOUS) IMPLANT
SET CARDIOPLEGIA MPS 5001102 (MISCELLANEOUS) ×4 IMPLANT
SPONGE GAUZE 4X4 12PLY (GAUZE/BANDAGES/DRESSINGS) ×8 IMPLANT
SPONGE LAP 4X18 X RAY DECT (DISPOSABLE) ×4 IMPLANT
SUT BONE WAX W31G (SUTURE) ×4 IMPLANT
SUT MNCRL AB 4-0 PS2 18 (SUTURE) ×4 IMPLANT
SUT PROLENE 3 0 SH DA (SUTURE) ×4 IMPLANT
SUT PROLENE 4 0 RB 1 (SUTURE)
SUT PROLENE 4 0 SH DA (SUTURE) IMPLANT
SUT PROLENE 4-0 RB1 .5 CRCL 36 (SUTURE) IMPLANT
SUT PROLENE 6 0 C 1 30 (SUTURE) ×16 IMPLANT
SUT PROLENE 7 0 BV 1 (SUTURE) ×8 IMPLANT
SUT PROLENE 7 0 BV1 MDA (SUTURE) ×8 IMPLANT
SUT PROLENE 8 0 BV175 6 (SUTURE) ×24 IMPLANT
SUT SILK  1 MH (SUTURE) ×2
SUT SILK 1 MH (SUTURE) ×2 IMPLANT
SUT STEEL 6MS V (SUTURE) ×4 IMPLANT
SUT STEEL STERNAL CCS#1 18IN (SUTURE) IMPLANT
SUT STEEL SZ 6 DBL 3X14 BALL (SUTURE) ×4 IMPLANT
SUT VIC AB 1 CTX 36 (SUTURE) ×6
SUT VIC AB 1 CTX36XBRD ANBCTR (SUTURE) ×4 IMPLANT
SUT VIC AB 2-0 CT1 27 (SUTURE) ×4
SUT VIC AB 2-0 CT1 TAPERPNT 27 (SUTURE) ×2 IMPLANT
SUT VIC AB 2-0 CTX 27 (SUTURE) IMPLANT
SUT VIC AB 3-0 SH 27 (SUTURE)
SUT VIC AB 3-0 SH 27X BRD (SUTURE) IMPLANT
SUT VIC AB 3-0 X1 27 (SUTURE) IMPLANT
SUT VICRYL 4-0 PS2 18IN ABS (SUTURE) IMPLANT
SUTURE E-PAK OPEN HEART (SUTURE) ×4 IMPLANT
SYSTEM SAHARA CHEST DRAIN ATS (WOUND CARE) ×4 IMPLANT
TAPE CLOTH SURG 4X10 WHT LF (GAUZE/BANDAGES/DRESSINGS) ×4 IMPLANT
TOWEL OR 17X24 6PK STRL BLUE (TOWEL DISPOSABLE) ×8 IMPLANT
TOWEL OR 17X26 10 PK STRL BLUE (TOWEL DISPOSABLE) ×8 IMPLANT
TRAY FOLEY IC TEMP SENS 14FR (CATHETERS) ×4 IMPLANT
TUBE FEEDING 8FR 16IN STR KANG (MISCELLANEOUS) ×4 IMPLANT
TUBING INSUFFLATION 10FT LAP (TUBING) ×4 IMPLANT
UNDERPAD 30X30 INCONTINENT (UNDERPADS AND DIAPERS) ×4 IMPLANT
WATER STERILE IRR 1000ML POUR (IV SOLUTION) ×8 IMPLANT

## 2014-02-02 NOTE — Brief Op Note (Addendum)
02/01/2014 - 02/02/2014  11:11 AM  PATIENT:  [name] West  63 y.o. male  PRE-OPERATIVE DIAGNOSIS:  Multivessel CAD (Left Main disease included)  POST-OPERATIVE DIAGNOSIS:  Multivessel CAD (Left Main disease included)   PROCEDURE:  INTRAOPERATIVE TRANSESOPHAGEAL ECHOCARDIOGRAM,   CORONARY ARTERY BYPASS GRAFTING (CABG) x 6    LIMA to LAD,    SVG sequentially to DIAGONAL 2 and DIAGONAL 3   FREE RIMA to OM    SVG SEQUENTIALLY TO DISTAL RCA AND PDA   EVH right thigh  SURGEON:  Surgeon(s) and Role:    * Melrose Nakayama, MD - Primary  PHYSICIAN ASSISTANT: Lars Pinks PA-C   ANESTHESIA:   general  EBL:  Total I/O In: -  Out: 500 [Urine:500]   DRAINS: Chest tubes in the mediastinal and pleural spaces   COUNTS CORRECT:  YES  PLAN OF CARE: Admit to inpatient   PATIENT DISPOSITION:  ICU - intubated and hemodynamically stable.   Delay start of Pharmacological VTE agent (>24hrs) due to surgical blood loss or risk of bleeding: yes  PRE OP WEIGHT: 103 kg  Good quality targets and conduits. LV function normal by TEE

## 2014-02-02 NOTE — Anesthesia Procedure Notes (Addendum)
Procedure Name: Intubation Date/Time: 02/02/2014 8:44 AM Performed by: Erik Obey Pre-anesthesia Checklist: Patient identified, Patient being monitored, Emergency Drugs available, Timeout performed and Suction available Patient Re-evaluated:Patient Re-evaluated prior to inductionOxygen Delivery Method: Circle system utilized Preoxygenation: Pre-oxygenation with 100% oxygen Intubation Type: IV induction Ventilation: Two handed mask ventilation required and Oral airway inserted - appropriate to patient size Laryngoscope Size: Mac and 3 Grade View: Grade I Tube type: Oral Tube size: 8.0 mm Number of attempts: 1 Airway Equipment and Method: Stylet Placement Confirmation: ETT inserted through vocal cords under direct vision,  positive ETCO2 and breath sounds checked- equal and bilateral Secured at: 23 cm Tube secured with: Tape Dental Injury: Teeth and Oropharynx as per pre-operative assessment       The patient was identified and consent obtained.  TO was performed, and full barrier precautions were used.  The skin was anesthetized with lidocaine.  Once the vein was located with the 22 ga. needle using ultrasound guidance , the wire was inserted into the vein.  The wire location was confirmed with ultrasound.  The insertion site was dilated and the introducer was carefully inserted and sutured in place. The PAC was checked, and floated into the PA.  Once in the PA, the catheter was secured. The patient tolerated the procedure well.  CXR was ordered for PACU. Start: 6256 End: [place] J. Tedra Senegal, MD

## 2014-02-02 NOTE — Progress Notes (Signed)
  Echocardiogram Echocardiogram Transesophageal has been performed.  Basilia Jumbo 02/02/2014, 9:42 AM

## 2014-02-02 NOTE — Preoperative (Signed)
Beta Blockers   Reason not to administer Beta Blockers:Hold  beta blocker due to Bradycardia (HR less than 50 bpm) 

## 2014-02-02 NOTE — H&P (View-Only) (Signed)
Reason for Consult:Left main and 3 vessel CAD Referring Physician: Dr. Ronny Flurry is an 63 y.o. male.  HPI:  63 yo with a chief complaint of SOB with exertion and palpitations.  Mr. Schlarb is a 63 yo man with no prior cardiac history. He does have a history of hyperlipidemia and hypertension as well as a strong family history of CAD. He is a Corporate treasurer. He is very active. He jogged on a regular basis until a few years ago when he had to stop due to joint issues. He continues to walk 4 miles several times a week. He noticed that he was getting tired more easily asn was having more frequent palpitations when he would check his pulse. He was not having any chest pain. He went ot Dr. Ronnald Ramp and then Dr. Debara Pickett for evaluation. Evaluation revealed sleep apnea and an elevated cardiac calcium score.  An exercise nuclear stress test was done on 01/05/2014. He exercised for 9 minutes and achieved 10 metabolic equivalents. Exercise was discontinued due to chest pain, shortness of breath and fatigue. There was 2 mm ST depression, frequent ventricular ectopy and transient bigeminy noted. The nuclear perfusion images were interpreted as high risk sowing an apical scar with peri-infarct ischemia and basal lateral ischemia. EF was 46%.   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 The LAD filled distally via collaterals. He was admitted and started on a heparin gtt. He is currently comfortable and without complaint.    Past Medical History  Diagnosis Date  . Hyperlipidemia   . Hypertension   . Palpitations     No past surgical history on file.  Family History  Problem Relation Age of Onset  . Cancer Mother   . Heart disease Father     MI at 80, CABG at 61, pacemaker at 24  . Heart attack Paternal Grandfather  43  . Heart failure Maternal Grandfather 60    Social History:  reports that he quit smoking about 40 years ago. He has never used smokeless tobacco. He reports that he drinks about 1.0 ounces of alcohol per week. He reports that he does not use illicit drugs.  Allergies: No Known Allergies  Medications:  Prior to Admission:  Prescriptions prior to admission  Medication Sig Dispense Refill  . aspirin 81 MG tablet Take 81 mg by mouth daily.      Marland Kitchen atorvastatin (LIPITOR) 40 MG tablet Take 40 mg by mouth daily.      . Cholecalciferol (VITAMIN D-3) 1000 UNITS CAPS Take 1 capsule by mouth daily.      Marland Kitchen lisinopril-hydrochlorothiazide (PRINZIDE,ZESTORETIC) 20-12.5 MG per tablet Take 1 tablet by mouth daily.      . metoprolol succinate (TOPROL-XL) 25 MG 24 hr tablet Take 1 tablet (25 mg total) by mouth daily.  30 tablet  6  . Multiple Vitamin (MULTIVITAMIN) capsule Take 1 capsule by mouth daily. With A, C, E      . Omega-3 Fatty Acids (FISH OIL) 1200 MG CAPS Take 1 capsule by mouth daily.      Marland Kitchen zolpidem (AMBIEN) 10 MG tablet Take 1 tablet (10 mg total) by mouth at bedtime as needed for sleep.  1 tablet  0    Results for orders placed during the hospital encounter of 02/01/14 (from the past  48 hour(s))  SURGICAL PCR SCREEN     Status: Abnormal   Collection Time    02/01/14  3:52 PM      Result Value Range   MRSA, PCR NEGATIVE  NEGATIVE   Staphylococcus aureus POSITIVE (*) NEGATIVE   Comment:            The Xpert SA Assay (FDA     approved for NASAL specimens     in patients over 67 years of age),     is one component of     a comprehensive surveillance     program.  Test performance has     been validated by [employer] for patients greater     than or equal to 72 year old.     It is not intended     to diagnose infection nor to     guide or monitor treatment.    Dg Chest 2 View  01/31/2014   CLINICAL DATA:  Preop evaluation.  EXAM: CHEST  2 VIEW  COMPARISON:  CT CARDIAC  SCORING dated 12/07/2013  FINDINGS: The heart size and mediastinal contours are within normal limits. Both lungs are clear. The visualized skeletal structures are unremarkable.  IMPRESSION: No active cardiopulmonary disease.   Electronically Signed   By: Margaree Mackintosh M.D.   On: 01/31/2014 13:59    Review of Systems  Constitutional: Positive for malaise/fatigue. Negative for fever, chills and weight loss.  Respiratory: Positive for shortness of breath. Negative for cough and wheezing.   Cardiovascular: Positive for chest pain (only during stress test) and palpitations. Negative for claudication and leg swelling.  All other systems reviewed and are negative.   Blood pressure 119/71, pulse 59, temperature 98.5 F (36.9 C), temperature source Oral, resp. rate 16, height 6\' 1"  (1.854 m), weight 227 lb 11.8 oz (103.3 kg), SpO2 95.00%. Physical Exam  Vitals reviewed. Constitutional: He is oriented to person, place, and time. He appears well-developed and well-nourished. No distress.  HENT:  Head: Normocephalic and atraumatic.  Eyes: Pupils are equal, round, and reactive to light.  Neck: Neck supple. No thyromegaly present.  No carotid bruits  Cardiovascular: Normal rate, regular rhythm, normal heart sounds and intact distal pulses.  Exam reveals no gallop and no friction rub.   No murmur heard. Respiratory: Effort normal and breath sounds normal. He has no wheezes. He has no rales.  GI: Soft. There is no tenderness.  Musculoskeletal: He exhibits edema.  Lymphadenopathy:    He has no cervical adenopathy.  Neurological: He is alert and oriented to person, place, and time. No cranial nerve deficit.  Skin: Skin is warm and dry.   Cardiac Catheterization Hemodynamics:  Central Aortic Pressure / Mean Aortic Pressure: 102/62  LV Pressure / LV End diastolic Pressure: 12  Left Ventriculography:  EF: 60-65%  Wall Motion: Mild apical hypokinesis  MR: 0  Coronary Angiographic Data:  Left Main:  Heavily calcified - there is moderate to severe distal LM stenosis. Bifurcates into the LAD and LCx in the usual fashion.  Left Anterior Descending (LAD): This is proximally occluded. Distal filling via collaterals from the RCA is noted.  1st diagonal (D1): Small, moderate to severe ostial stenosis  2nd diagonal (D2): Small, diffusely diseased  3rd diagonal (D3): Small, diffusely diseased  Circumflex (LCx): Large vessel which is subtotally (99%) occluded in the ostial/proximal segment and there is severe mid-vessel disease as well. Collaterals from the PDA are noted.  1st obtuse marginal:  Larger vessel with moderate stenosis.  Right Coronary Artery: Large, ectatic appearing vessel which is dominant. There is 70-80% mid-distal stenosis prior to the PLB/PDA bifurcation.  right ventricle branch of right coronary artery: no stenosis  posterior descending artery: There is severe proximal stenosis - collaterals are seen distally  posterior lateral branch: Mild diffuse disease Impression:  1. Severe LM and multivessel CAD. Occluded LAD, subtotally occluded proximal LCX and severe distal RCA disease with collaterals.  2. LVEF 60-65% with a mild anteroapical WMA.  3. LVEDP = 12 mmHg.  Plan:  1. Would recommend admission to stepdown and starting anticoagulation on heparin due to severity of CAD.  2. CT surgery consult for CABG recommendations.  3. 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 CABG is indicated for survival benefit and relief of symptoms.   I have discussed with Mr and Mrs Loga the general nature of the procedure, the need for general anesthesia, and the incisions to be used. We discussed the expected hospital stay, overall recovery and short and long term outcomes. We discussed the indications, risks, benefits and alternatives(medical therapy). They understand the risks include, but are not limited to death,  stroke, MI, DVT/PE, bleeding, possible need for transfusion, infections,other organ system dysfunction including respiratory, renal, or GI complications. He accepts the risks and agrees to proceed.  Carotids and PFts OK, labs OK  Plan CABG tomorrow 1st case  Panzy Bubeck C 02/01/2014, 6:00 PM

## 2014-02-02 NOTE — Progress Notes (Signed)
CT surgery p.m. Rounds  Status post multivessel CABG earlier today Patient awake and mildly responsive on vent- - initial preextubation blood gas was out of range with PCO2 54 and pH 7.27, patient now resting on IMV  Stable hemodynamics Not bleeding Will  be ready to attempt another preextubation trial 7

## 2014-02-02 NOTE — Transfer of Care (Signed)
Immediate Anesthesia Transfer of Care Note  Patient: [name] West  Procedure(s) Performed: Procedure(s): CORONARY ARTERY BYPASS GRAFTING (CABG) times six using bilateral mammaries and right saphenous vein. (N/A) INTRAOPERATIVE TRANSESOPHAGEAL ECHOCARDIOGRAM (N/A)  Patient Location: SICU  Anesthesia Type:General  Level of Consciousness: sedated and Patient remains intubated per anesthesia plan  Airway & Oxygen Therapy: Patient remains intubated per anesthesia plan and Patient placed on Ventilator (see vital sign flow sheet for setting)  Post-op Assessment: Report given to PACU RN and Post -op Vital signs reviewed and stable  Post vital signs: Reviewed and stable  Complications: No apparent anesthesia complications

## 2014-02-02 NOTE — OR Nursing (Signed)
1st call to SICU 1400. 2nd call to SICU 1429

## 2014-02-02 NOTE — Procedures (Signed)
Extubation Procedure Note  Patient Details:   Name: [name] West DOB: 1951-11-29 MRN: [id number]   Airway Documentation:     Evaluation  O2 sats: stable throughout Complications: No apparent complications Patient did tolerate procedure well. Bilateral Breath Sounds: Clear Suctioning: Airway Yes  Patient extubated to [place].  VC of 850, NIF of -26.  IS x6 of 600.  Positive cuff leak.  Patient able to speak post extubation.  Philomena Doheny 02/02/2014, 9:21 PM

## 2014-02-02 NOTE — Anesthesia Postprocedure Evaluation (Signed)
  Anesthesia Post-op Note  Patient: [name] West  Procedure(s) Performed: Procedure(s): CORONARY ARTERY BYPASS GRAFTING (CABG) times six using bilateral mammaries and right saphenous vein. (N/A) INTRAOPERATIVE TRANSESOPHAGEAL ECHOCARDIOGRAM (N/A)  Patient Location: ICU  Anesthesia Type:General  Level of Consciousness: sedated and unresponsive  Airway and Oxygen Therapy: Patient remains intubated and on ventilator  Post-op Pain: none  Post-op Assessment: Post-op Vital signs reviewed, Patient's Cardiovascular Status Stable and Respiratory Function Stable  Post-op Vital Signs: Reviewed  Filed Vitals:   02/02/14 1545  BP:   Pulse: 80  Temp: 35.9 C  Resp: 12    Complications: No apparent anesthesia complications

## 2014-02-02 NOTE — Interval H&P Note (Signed)
History and Physical Interval Note:  02/02/2014 8:17 AM  [name] West  has presented today for surgery, with the diagnosis of CAD  The various methods of treatment have been discussed with the patient and family. After consideration of risks, benefits and other options for treatment, the patient has consented to  Procedure(s): CORONARY ARTERY BYPASS GRAFTING (CABG) (N/A) INTRAOPERATIVE TRANSESOPHAGEAL ECHOCARDIOGRAM (N/A) as a surgical intervention .  The patient's history has been reviewed, patient examined, no change in status, stable for surgery.  I have reviewed the patient's chart and labs.  Questions were answered to the patient's satisfaction.     Nariya Neumeyer C

## 2014-02-02 NOTE — Anesthesia Preprocedure Evaluation (Signed)
Anesthesia Evaluation  Patient identified by MRN, date of birth, ID band Patient awake    Reviewed: Allergy & Precautions, H&P , NPO status , Patient's Chart, lab work & pertinent test results, reviewed documented beta blocker date and time   Airway       Dental no notable dental hx.    Pulmonary neg pulmonary ROS, former smoker,    Pulmonary exam normal       Cardiovascular hypertension, On Medications and On Home Beta Blockers + CAD     Neuro/Psych negative neurological ROS  negative psych ROS   GI/Hepatic negative GI ROS, Neg liver ROS,   Endo/Other  negative endocrine ROS  Renal/GU negative Renal ROS  negative genitourinary   Musculoskeletal   Abdominal   Peds  Hematology negative hematology ROS (+)   Anesthesia Other Findings   Reproductive/Obstetrics negative OB ROS                           Anesthesia Physical Anesthesia Plan  ASA: IV  Anesthesia Plan: General   Post-op Pain Management:    Induction: Intravenous  Airway Management Planned: Oral ETT  Additional Equipment: Arterial line, CVP, PA Cath, TEE and Ultrasound Guidance Line Placement  Intra-op Plan:   Post-operative Plan: Post-operative intubation/ventilation  Informed Consent: I have reviewed the patients History and Physical, chart, labs and discussed the procedure including the risks, benefits and alternatives for the proposed anesthesia with the patient or authorized representative who has indicated his/her understanding and acceptance.   Dental advisory given  Plan Discussed with: CRNA  Anesthesia Plan Comments:         Anesthesia Quick Evaluation

## 2014-02-03 ENCOUNTER — Inpatient Hospital Stay (HOSPITAL_COMMUNITY): Payer: BC Managed Care – PPO

## 2014-02-03 ENCOUNTER — Encounter (HOSPITAL_COMMUNITY): Payer: Self-pay | Admitting: Thoracic Surgery (Cardiothoracic Vascular Surgery)

## 2014-02-03 DIAGNOSIS — I4949 Other premature depolarization: Secondary | ICD-10-CM

## 2014-02-03 DIAGNOSIS — R002 Palpitations: Secondary | ICD-10-CM

## 2014-02-03 DIAGNOSIS — I10 Essential (primary) hypertension: Secondary | ICD-10-CM

## 2014-02-03 DIAGNOSIS — R9389 Abnormal findings on diagnostic imaging of other specified body structures: Secondary | ICD-10-CM

## 2014-02-03 DIAGNOSIS — E785 Hyperlipidemia, unspecified: Secondary | ICD-10-CM

## 2014-02-03 DIAGNOSIS — Z01818 Encounter for other preprocedural examination: Secondary | ICD-10-CM

## 2014-02-03 DIAGNOSIS — Z8249 Family history of ischemic heart disease and other diseases of the circulatory system: Secondary | ICD-10-CM

## 2014-02-03 DIAGNOSIS — R9439 Abnormal result of other cardiovascular function study: Secondary | ICD-10-CM

## 2014-02-03 DIAGNOSIS — I2510 Atherosclerotic heart disease of native coronary artery without angina pectoris: Secondary | ICD-10-CM

## 2014-02-03 LAB — POCT I-STAT, CHEM 8
BUN: 13 mg/dL (ref 6–23)
CHLORIDE: 98 meq/L (ref 96–112)
Calcium, Ion: 1.19 mmol/L (ref 1.13–1.30)
Creatinine, Ser: 0.9 mg/dL (ref 0.50–1.35)
Glucose, Bld: 129 mg/dL — ABNORMAL HIGH (ref 70–99)
HEMATOCRIT: 35 % — AB (ref 39.0–52.0)
Hemoglobin: 11.9 g/dL — ABNORMAL LOW (ref 13.0–17.0)
POTASSIUM: 4.2 meq/L (ref 3.7–5.3)
SODIUM: 137 meq/L (ref 137–147)
TCO2: 25 mmol/L (ref 0–100)

## 2014-02-03 LAB — BASIC METABOLIC PANEL
BUN: 11 mg/dL (ref 6–23)
CALCIUM: 7.7 mg/dL — AB (ref 8.4–10.5)
CO2: 21 mEq/L (ref 19–32)
Chloride: 104 mEq/L (ref 96–112)
Creatinine, Ser: 0.78 mg/dL (ref 0.50–1.35)
GLUCOSE: 113 mg/dL — AB (ref 70–99)
POTASSIUM: 4.2 meq/L (ref 3.7–5.3)
SODIUM: 139 meq/L (ref 137–147)

## 2014-02-03 LAB — CBC
HCT: 32.6 % — ABNORMAL LOW (ref 39.0–52.0)
HEMATOCRIT: 32.6 % — AB (ref 39.0–52.0)
HEMOGLOBIN: 11.1 g/dL — AB (ref 13.0–17.0)
Hemoglobin: 11.3 g/dL — ABNORMAL LOW (ref 13.0–17.0)
MCH: 31.5 pg (ref 26.0–34.0)
MCH: 31.1 pg (ref 26.0–34.0)
MCHC: 34.7 g/dL (ref 30.0–36.0)
MCHC: 34 g/dL (ref 30.0–36.0)
MCV: 90.8 fL (ref 78.0–100.0)
MCV: 91.3 fL (ref 78.0–100.0)
PLATELETS: 139 10*3/uL — AB (ref 150–400)
Platelets: 122 10*3/uL — ABNORMAL LOW (ref 150–400)
RBC: 3.59 MIL/uL — ABNORMAL LOW (ref 4.22–5.81)
RBC: 3.57 MIL/uL — ABNORMAL LOW (ref 4.22–5.81)
RDW: 13.2 % (ref 11.5–15.5)
RDW: 13.4 % (ref 11.5–15.5)
WBC: 13 10*3/uL — ABNORMAL HIGH (ref 4.0–10.5)
WBC: 12.3 10*3/uL — AB (ref 4.0–10.5)

## 2014-02-03 LAB — GLUCOSE, CAPILLARY
GLUCOSE-CAPILLARY: 108 mg/dL — AB (ref 70–99)
GLUCOSE-CAPILLARY: 116 mg/dL — AB (ref 70–99)
GLUCOSE-CAPILLARY: 121 mg/dL — AB (ref 70–99)
Glucose-Capillary: 103 mg/dL — ABNORMAL HIGH (ref 70–99)
Glucose-Capillary: 107 mg/dL — ABNORMAL HIGH (ref 70–99)
Glucose-Capillary: 111 mg/dL — ABNORMAL HIGH (ref 70–99)
Glucose-Capillary: 113 mg/dL — ABNORMAL HIGH (ref 70–99)
Glucose-Capillary: 122 mg/dL — ABNORMAL HIGH (ref 70–99)
Glucose-Capillary: 133 mg/dL — ABNORMAL HIGH (ref 70–99)
Glucose-Capillary: 88 mg/dL (ref 70–99)
Glucose-Capillary: 90 mg/dL (ref 70–99)
Glucose-Capillary: 97 mg/dL (ref 70–99)
Glucose-Capillary: 97 mg/dL (ref 70–99)

## 2014-02-03 LAB — CREATININE, SERUM: Creatinine, Ser: 0.86 mg/dL (ref 0.50–1.35)

## 2014-02-03 LAB — MAGNESIUM
MAGNESIUM: 2.3 mg/dL (ref 1.5–2.5)
MAGNESIUM: 2.5 mg/dL (ref 1.5–2.5)

## 2014-02-03 MED ORDER — INSULIN ASPART 100 UNIT/ML ~~LOC~~ SOLN: 0.0000 [IU] | SUBCUTANEOUS | Status: DC

## 2014-02-03 MED ORDER — ENOXAPARIN SODIUM 40 MG/0.4ML ~~LOC~~ SOLN
40.0000 mg | Freq: Every day | SUBCUTANEOUS | Status: DC
  Administered 2014-02-03 – 2014-02-06 (×4): 40 mg via SUBCUTANEOUS
  Filled 2014-02-03 (×6): qty 0.4

## 2014-02-03 MED ORDER — INSULIN ASPART 100 UNIT/ML ~~LOC~~ SOLN
0.0000 [IU] | SUBCUTANEOUS | Status: DC
  Administered 2014-02-03 – 2014-02-04 (×2): 2 [IU] via SUBCUTANEOUS

## 2014-02-03 MED ORDER — FUROSEMIDE 10 MG/ML IJ SOLN
20.0000 mg | Freq: Once | INTRAMUSCULAR | Status: AC
  Administered 2014-02-03: 20 mg via INTRAVENOUS
  Filled 2014-02-03: qty 2

## 2014-02-03 MED ORDER — ALBUMIN HUMAN 5 % IV SOLN
12.5000 g | Freq: Once | INTRAVENOUS | Status: AC
  Administered 2014-02-03: 12.5 g via INTRAVENOUS
  Filled 2014-02-03: qty 250

## 2014-02-03 MED FILL — Sodium Bicarbonate IV Soln 8.4%: INTRAVENOUS | Qty: 50 | Status: AC

## 2014-02-03 MED FILL — Lidocaine HCl IV Inj 20 MG/ML: INTRAVENOUS | Qty: 10 | Status: AC

## 2014-02-03 MED FILL — Sodium Chloride IV Soln 0.9%: INTRAVENOUS | Qty: 2000 | Status: AC

## 2014-02-03 MED FILL — Electrolyte-R (PH 7.4) Solution: INTRAVENOUS | Qty: 3000 | Status: AC

## 2014-02-03 MED FILL — Heparin Sodium (Porcine) Inj 1000 Unit/ML: INTRAMUSCULAR | Qty: 20 | Status: AC

## 2014-02-03 MED FILL — Mannitol IV Soln 20%: INTRAVENOUS | Qty: 500 | Status: AC

## 2014-02-03 NOTE — Progress Notes (Signed)
Patient ID: [name] West, male   DOB: August 17, 1951, 63 y.o.   MRN: [id number] EVENING ROUNDS NOTE :     [address]       [place] [address]             [phone]                 1 Day Post-Op Procedure(s) (LRB): CORONARY ARTERY BYPASS GRAFTING (CABG) times six using bilateral mammaries and right saphenous vein. (N/A) INTRAOPERATIVE TRANSESOPHAGEAL ECHOCARDIOGRAM (N/A)  Total Length of Stay:  LOS: 2 days  BP 88/61  Pulse 80  Temp(Src) 97.5 F (36.4 C) (Oral)  Resp 17  Ht 6\' 1"  (1.854 m)  Wt 234 lb 12.6 oz (106.5 kg)  BMI 30.98 kg/m2  SpO2 95%  .Intake/Output     02/11 0701 - 02/12 0700 02/12 0701 - 02/13 0700   P.O. 480 200   I.V. (mL/kg) 3665.6 (34.4) 192.5 (1.8)   Blood 548    NG/GT 60    IV Piggyback 1600 50   Total Intake(mL/kg) 6353.6 (59.7) 442.5 (4.2)   Urine (mL/kg/hr) 3176 (1.2) 685 (0.6)   Emesis/NG output 50 (0)    Blood 875 (0.3)    Chest Tube 498 (0.2) 120 (0.1)   Total Output 4599 805   Net +1754.6 -362.5          . sodium chloride 20 mL/hr (02/02/14 1538)  . sodium chloride 20 mL/hr (02/02/14 1515)  . sodium chloride    . dexmedetomidine Stopped (02/02/14 1655)  . lactated ringers 20 mL/hr (02/02/14 1542)  . nitroGLYCERIN Stopped (02/02/14 1515)  . phenylephrine (NEO-SYNEPHRINE) Adult infusion Stopped (02/03/14 1000)     Lab Results  Component Value Date   WBC 12.3* 02/03/2014   HGB 11.9* 02/03/2014   HCT 35.0* 02/03/2014   PLT 122* 02/03/2014   GLUCOSE 129* 02/03/2014   NA 137 02/03/2014   K 4.2 02/03/2014   CL 98 02/03/2014   CREATININE 0.90 02/03/2014   BUN 13 02/03/2014   CO2 21 02/03/2014   TSH 1.955 01/31/2014   INR 1.37 02/02/2014   Up to chair, stable day Walked in unit  Grace Isaac MD  Beeper [phone] Office [phone] 02/03/2014 6:07 PM

## 2014-02-03 NOTE — Progress Notes (Signed)
Subjective:  POD # 1 CABG X 6. Looks great. No complaints  Objective:  Temp:  [96.6 F (35.9 C)-101.3 F (38.5 C)] 99.7 F (37.6 C) (02/12 0900) Pulse Rate:  [79-113] 80 (02/12 0900) Resp:  [6-28] 21 (02/12 0900) BP: (79-100)/(52-67) 94/56 mmHg (02/12 0900) SpO2:  [91 %-100 %] 100 % (02/12 0900) FiO2 (%):  [40 %-50 %] 40 % (02/11 2000) Weight:  [227 lb 1.2 oz (103 kg)-234 lb 12.6 oz (106.5 kg)] 234 lb 12.6 oz (106.5 kg) (02/12 0500) Weight change: -3 lb 14.8 oz (-1.781 kg)  Intake/Output from previous day: 02/11 0701 - 02/12 0700 In: 6353.6 [P.O.:480; I.V.:3665.6; Blood:548; NG/GT:60; IV Piggyback:1600] Out: 4599 [Urine:3176; Emesis/NG output:50; Blood:875; Chest Tube:498]  Intake/Output from this shift: Total I/O In: 90 [I.V.:90] Out: 215 [Urine:145; Chest Tube:70]  Physical Exam: General appearance: alert and no distress Neck: no adenopathy, no carotid bruit, no JVD, supple, symmetrical, trachea midline and thyroid not enlarged, symmetric, no tenderness/mass/nodules Lungs: clear to auscultation bilaterally Heart: regular rate and rhythm, S1, S2 normal, no murmur, click, rub or gallop Extremities: extremities normal, atraumatic, no cyanosis or edema  Lab Results: Results for orders placed during the hospital encounter of 02/01/14 (from the past 48 hour(s))  SURGICAL PCR SCREEN     Status: Abnormal   Collection Time    02/01/14  3:52 PM      Result Value Ref Range   MRSA, PCR NEGATIVE  NEGATIVE   Staphylococcus aureus POSITIVE (*) NEGATIVE   Comment:            The Xpert SA Assay (FDA     approved for NASAL specimens     in patients over 63 years of age),     is one component of     a comprehensive surveillance     program.  Test performance has     been validated by [employer] for patients greater     than or equal to 63 year old.     It is not intended     to diagnose infection nor to     guide or monitor treatment.  TYPE AND SCREEN     Status:  None   Collection Time    02/01/14  7:48 PM      Result Value Ref Range   ABO/RH(D) O POS     Antibody Screen NEG     Sample Expiration 02/04/2014    ABO/RH     Status: None   Collection Time    02/01/14  7:48 PM      Result Value Ref Range   ABO/RH(D) O POS    URINALYSIS, ROUTINE W REFLEX MICROSCOPIC     Status: Abnormal   Collection Time    02/01/14 10:05 PM      Result Value Ref Range   Color, Urine YELLOW  YELLOW   APPearance CLEAR  CLEAR   Specific Gravity, Urine 1.012  1.005 - 1.030   pH 7.0  5.0 - 8.0   Glucose, UA NEGATIVE  NEGATIVE mg/dL   Hgb urine dipstick TRACE (*) NEGATIVE   Bilirubin Urine NEGATIVE  NEGATIVE   Ketones, ur NEGATIVE  NEGATIVE mg/dL   Protein, ur NEGATIVE  NEGATIVE mg/dL   Urobilinogen, UA 0.2  0.0 - 1.0 mg/dL   Nitrite NEGATIVE  NEGATIVE   Leukocytes, UA NEGATIVE  NEGATIVE  URINE MICROSCOPIC-ADD ON     Status: None   Collection Time  02/01/14 10:05 PM      Result Value Ref Range   RBC / HPF 0-2  <3 RBC/hpf  BLOOD GAS, ARTERIAL     Status: Abnormal   Collection Time    02/01/14 10:26 PM      Result Value Ref Range   FIO2 0.21     pH, Arterial 7.445  7.350 - 7.450   pCO2 arterial 35.6  35.0 - 45.0 mmHg   pO2, Arterial 78.2 (*) 80.0 - 100.0 mmHg   Bicarbonate 24.1 (*) 20.0 - 24.0 mEq/L   TCO2 25.2  0 - 100 mmol/L   Acid-Base Excess 0.5  0.0 - 2.0 mmol/L   O2 Saturation 96.2     Patient temperature 98.6     Collection site LEFT RADIAL     Drawn by [id number]     Sample type ARTERIAL DRAW     Allens test (pass/fail) PASS  PASS  HEPARIN LEVEL (UNFRACTIONATED)     Status: Abnormal   Collection Time    02/02/14  3:20 AM      Result Value Ref Range   Heparin Unfractionated 0.20 (*) 0.30 - 0.70 IU/mL   Comment:            IF HEPARIN RESULTS ARE BELOW     EXPECTED VALUES, AND PATIENT     DOSAGE HAS BEEN CONFIRMED,     SUGGEST FOLLOW UP TESTING     OF ANTITHROMBIN III LEVELS.  CBC     Status: None   Collection Time    02/02/14  3:20 AM       Result Value Ref Range   WBC 9.4  4.0 - 10.5 K/uL   RBC 4.68  4.22 - 5.81 MIL/uL   Hemoglobin 14.4  13.0 - 17.0 g/dL   HCT 42.3  39.0 - 52.0 %   MCV 90.4  78.0 - 100.0 fL   MCH 30.8  26.0 - 34.0 pg   MCHC 34.0  30.0 - 36.0 g/dL   RDW 13.1  11.5 - 15.5 %   Platelets 215  150 - 400 K/uL  BASIC METABOLIC PANEL     Status: Abnormal   Collection Time    02/02/14  3:20 AM      Result Value Ref Range   Sodium 141  137 - 147 mEq/L   Potassium 4.8  3.7 - 5.3 mEq/L   Chloride 103  96 - 112 mEq/L   CO2 25  19 - 32 mEq/L   Glucose, Bld 100 (*) 70 - 99 mg/dL   BUN 13  6 - 23 mg/dL   Creatinine, Ser 0.94  0.50 - 1.35 mg/dL   Calcium 9.4  8.4 - 10.5 mg/dL   GFR calc non Af Amer 88 (*) >90 mL/min   GFR calc Af Amer >90  >90 mL/min   Comment: (NOTE)     The eGFR has been calculated using the CKD EPI equation.     This calculation has not been validated in all clinical situations.     eGFR's persistently <90 mL/min signify possible Chronic Kidney     Disease.  POCT I-STAT 4, (NA,K, GLUC, HGB,HCT)     Status: Abnormal   Collection Time    02/02/14  8:50 AM      Result Value Ref Range   Sodium 140  137 - 147 mEq/L   Potassium 4.0  3.7 - 5.3 mEq/L   Glucose, Bld 116 (*) 70 - 99 mg/dL   HCT 38.0 (*) 39.0 -  52.0 %   Hemoglobin 12.9 (*) 13.0 - 17.0 g/dL  POCT I-STAT GLUCOSE     Status: Abnormal   Collection Time    02/02/14 10:28 AM      Result Value Ref Range   Operator id [id number]     Glucose, Bld 127 (*) 70 - 99 mg/dL  POCT I-STAT 4, (NA,K, GLUC, HGB,HCT)     Status: Abnormal   Collection Time    02/02/14 10:49 AM      Result Value Ref Range   Sodium 140  137 - 147 mEq/L   Potassium 4.2  3.7 - 5.3 mEq/L   Glucose, Bld 136 (*) 70 - 99 mg/dL   HCT 36.0 (*) 39.0 - 52.0 %   Hemoglobin 12.2 (*) 13.0 - 17.0 g/dL  POCT I-STAT 4, (NA,K, GLUC, HGB,HCT)     Status: Abnormal   Collection Time    02/02/14 11:30 AM      Result Value Ref Range   Sodium 136 (*) 137 - 147 mEq/L   Potassium 3.9   3.7 - 5.3 mEq/L   Glucose, Bld 128 (*) 70 - 99 mg/dL   HCT 29.0 (*) 39.0 - 52.0 %   Hemoglobin 9.9 (*) 13.0 - 17.0 g/dL  POCT I-STAT 3, BLOOD GAS (G3+)     Status: Abnormal   Collection Time    02/02/14 11:34 AM      Result Value Ref Range   pH, Arterial 7.398  7.350 - 7.450   pCO2 arterial 42.0  35.0 - 45.0 mmHg   pO2, Arterial 316.0 (*) 80.0 - 100.0 mmHg   Bicarbonate 25.9 (*) 20.0 - 24.0 mEq/L   TCO2 27  0 - 100 mmol/L   O2 Saturation 100.0     Acid-Base Excess 1.0  0.0 - 2.0 mmol/L   Sample type ARTERIAL    POCT I-STAT 4, (NA,K, GLUC, HGB,HCT)     Status: Abnormal   Collection Time    02/02/14 12:34 PM      Result Value Ref Range   Sodium 137  137 - 147 mEq/L   Potassium 4.5  3.7 - 5.3 mEq/L   Glucose, Bld 147 (*) 70 - 99 mg/dL   HCT 32.0 (*) 39.0 - 52.0 %   Hemoglobin 10.9 (*) 13.0 - 17.0 g/dL  HEMOGLOBIN AND HEMATOCRIT, BLOOD     Status: Abnormal   Collection Time    02/02/14 12:50 PM      Result Value Ref Range   Hemoglobin 11.0 (*) 13.0 - 17.0 g/dL   Comment: REPEATED TO VERIFY     RESULT CALLED TO, READ BACK BY AND VERIFIED WITH:     WAGONER,T RN @ 1308 02/02/14 LEONARD,A   HCT 30.7 (*) 39.0 - 52.0 %   Comment: REPEATED TO VERIFY     RESULT CALLED TO, READ BACK BY AND VERIFIED WITH:     WAGONER,T RN @ 1308 02/02/14 LEONARD,A  PLATELET COUNT     Status: None   Collection Time    02/02/14 12:50 PM      Result Value Ref Range   Platelets 152  150 - 400 K/uL   Comment: REPEATED TO VERIFY     RESULT CALLED TO, READ BACK BY AND VERIFIED WITH:     WAGONER,T RN @ 3716 02/02/14 LEONARD,A  POCT I-STAT 4, (NA,K, GLUC, HGB,HCT)     Status: Abnormal   Collection Time    02/02/14  1:30 PM      Result Value Ref Range  Sodium 139  137 - 147 mEq/L   Potassium 4.8  3.7 - 5.3 mEq/L   Glucose, Bld 144 (*) 70 - 99 mg/dL   HCT 33.0 (*) 39.0 - 52.0 %   Hemoglobin 11.2 (*) 13.0 - 17.0 g/dL  POCT I-STAT 4, (NA,K, GLUC, HGB,HCT)     Status: Abnormal   Collection Time    02/02/14   2:06 PM      Result Value Ref Range   Sodium 139  137 - 147 mEq/L   Potassium 4.6  3.7 - 5.3 mEq/L   Glucose, Bld 156 (*) 70 - 99 mg/dL   HCT 32.0 (*) 39.0 - 52.0 %   Hemoglobin 10.9 (*) 13.0 - 17.0 g/dL  POCT I-STAT 3, BLOOD GAS (G3+)     Status: Abnormal   Collection Time    02/02/14  2:10 PM      Result Value Ref Range   pH, Arterial 7.415  7.350 - 7.450   pCO2 arterial 38.0  35.0 - 45.0 mmHg   pO2, Arterial 87.0  80.0 - 100.0 mmHg   Bicarbonate 24.4 (*) 20.0 - 24.0 mEq/L   TCO2 26  0 - 100 mmol/L   O2 Saturation 97.0     Sample type ARTERIAL    CBC     Status: Abnormal   Collection Time    02/02/14  3:08 PM      Result Value Ref Range   WBC 13.0 (*) 4.0 - 10.5 K/uL   RBC 4.16 (*) 4.22 - 5.81 MIL/uL   Hemoglobin 13.1  13.0 - 17.0 g/dL   Comment: REPEATED TO VERIFY     DELTA CHECK NOTED   HCT 37.1 (*) 39.0 - 52.0 %   MCV 89.2  78.0 - 100.0 fL   MCH 31.5  26.0 - 34.0 pg   MCHC 35.3  30.0 - 36.0 g/dL   RDW 13.0  11.5 - 15.5 %   Platelets 126 (*) 150 - 400 K/uL  PROTIME-INR     Status: Abnormal   Collection Time    02/02/14  3:08 PM      Result Value Ref Range   Prothrombin Time 16.5 (*) 11.6 - 15.2 seconds   INR 1.37  0.00 - 1.49  APTT     Status: None   Collection Time    02/02/14  3:08 PM      Result Value Ref Range   aPTT 28  24 - 37 seconds  POCT I-STAT 4, (NA,K, GLUC, HGB,HCT)     Status: Abnormal   Collection Time    02/02/14  3:18 PM      Result Value Ref Range   Sodium 142  137 - 147 mEq/L   Potassium 3.8  3.7 - 5.3 mEq/L   Glucose, Bld 116 (*) 70 - 99 mg/dL   HCT 36.0 (*) 39.0 - 52.0 %   Hemoglobin 12.2 (*) 13.0 - 17.0 g/dL  POCT I-STAT 3, BLOOD GAS (G3+)     Status: None   Collection Time    02/02/14  3:18 PM      Result Value Ref Range   pH, Arterial 7.352  7.350 - 7.450   pCO2 arterial 42.5  35.0 - 45.0 mmHg   pO2, Arterial 81.0  80.0 - 100.0 mmHg   Bicarbonate 23.9  20.0 - 24.0 mEq/L   TCO2 25  0 - 100 mmol/L   O2 Saturation 96.0      Acid-base deficit 2.0  0.0 - 2.0 mmol/L  Patient temperature 36.0 C     Collection site ARTERIAL LINE     Drawn by Nurse     Sample type ARTERIAL    GLUCOSE, CAPILLARY     Status: Abnormal   Collection Time    02/02/14  4:30 PM      Result Value Ref Range   Glucose-Capillary 102 (*) 70 - 99 mg/dL  GLUCOSE, CAPILLARY     Status: None   Collection Time    02/02/14  5:35 PM      Result Value Ref Range   Glucose-Capillary 94  70 - 99 mg/dL  GLUCOSE, CAPILLARY     Status: Abnormal   Collection Time    02/02/14  6:42 PM      Result Value Ref Range   Glucose-Capillary 104 (*) 70 - 99 mg/dL  POCT I-STAT 3, BLOOD GAS (G3+)     Status: Abnormal   Collection Time    02/02/14  6:46 PM      Result Value Ref Range   pH, Arterial 7.273 (*) 7.350 - 7.450   pCO2 arterial 53.1 (*) 35.0 - 45.0 mmHg   pO2, Arterial 102.0 (*) 80.0 - 100.0 mmHg   Bicarbonate 24.6 (*) 20.0 - 24.0 mEq/L   TCO2 26  0 - 100 mmol/L   O2 Saturation 97.0     Acid-base deficit 3.0 (*) 0.0 - 2.0 mmol/L   Patient temperature 36.9 C     Collection site ARTERIAL LINE     Drawn by Operator     Sample type ARTERIAL    GLUCOSE, CAPILLARY     Status: Abnormal   Collection Time    02/02/14  7:57 PM      Result Value Ref Range   Glucose-Capillary 121 (*) 70 - 99 mg/dL  POCT I-STAT 3, BLOOD GAS (G3+)     Status: Abnormal   Collection Time    02/02/14  7:59 PM      Result Value Ref Range   pH, Arterial 7.304 (*) 7.350 - 7.450   pCO2 arterial 50.4 (*) 35.0 - 45.0 mmHg   pO2, Arterial 97.0  80.0 - 100.0 mmHg   Bicarbonate 24.8 (*) 20.0 - 24.0 mEq/L   TCO2 26  0 - 100 mmol/L   O2 Saturation 96.0     Acid-base deficit 2.0  0.0 - 2.0 mmol/L   Patient temperature 37.8 C     Sample type ARTERIAL    CBC     Status: Abnormal   Collection Time    02/02/14  9:00 PM      Result Value Ref Range   WBC 13.0 (*) 4.0 - 10.5 K/uL   RBC 3.94 (*) 4.22 - 5.81 MIL/uL   Hemoglobin 12.4 (*) 13.0 - 17.0 g/dL   HCT 35.7 (*) 39.0 - 52.0 %     MCV 90.6  78.0 - 100.0 fL   MCH 31.5  26.0 - 34.0 pg   MCHC 34.7  30.0 - 36.0 g/dL   RDW 13.1  11.5 - 15.5 %   Platelets 158  150 - 400 K/uL  MAGNESIUM     Status: Abnormal   Collection Time    02/02/14  9:00 PM      Result Value Ref Range   Magnesium 3.0 (*) 1.5 - 2.5 mg/dL  CREATININE, SERUM     Status: None   Collection Time    02/02/14  9:00 PM      Result Value Ref Range   Creatinine, Ser  0.83  0.50 - 1.35 mg/dL   GFR calc non Af Amer >90  >90 mL/min   GFR calc Af Amer >90  >90 mL/min   Comment: (NOTE)     The eGFR has been calculated using the CKD EPI equation.     This calculation has not been validated in all clinical situations.     eGFR's persistently <90 mL/min signify possible Chronic Kidney     Disease.  GLUCOSE, CAPILLARY     Status: Abnormal   Collection Time    02/02/14  9:11 PM      Result Value Ref Range   Glucose-Capillary 116 (*) 70 - 99 mg/dL  POCT I-STAT 3, BLOOD GAS (G3+)     Status: Abnormal   Collection Time    02/02/14  9:14 PM      Result Value Ref Range   pH, Arterial 7.342 (*) 7.350 - 7.450   pCO2 arterial 43.1  35.0 - 45.0 mmHg   pO2, Arterial 95.0  80.0 - 100.0 mmHg   Bicarbonate 23.4  20.0 - 24.0 mEq/L   TCO2 25  0 - 100 mmol/L   O2 Saturation 97.0     Acid-base deficit 2.0  0.0 - 2.0 mmol/L   Sample type ARTERIAL    POCT I-STAT, CHEM 8     Status: Abnormal   Collection Time    02/02/14  9:19 PM      Result Value Ref Range   Sodium 142  137 - 147 mEq/L   Potassium 4.3  3.7 - 5.3 mEq/L   Chloride 105  96 - 112 mEq/L   BUN 9  6 - 23 mg/dL   Creatinine, Ser 0.90  0.50 - 1.35 mg/dL   Glucose, Bld 103 (*) 70 - 99 mg/dL   Calcium, Ion 1.14  1.13 - 1.30 mmol/L   TCO2 23  0 - 100 mmol/L   Hemoglobin 12.2 (*) 13.0 - 17.0 g/dL   HCT 36.0 (*) 39.0 - 52.0 %  GLUCOSE, CAPILLARY     Status: None   Collection Time    02/02/14 10:11 PM      Result Value Ref Range   Glucose-Capillary 97  70 - 99 mg/dL  GLUCOSE, CAPILLARY     Status: Abnormal    Collection Time    02/02/14 11:08 PM      Result Value Ref Range   Glucose-Capillary 133 (*) 70 - 99 mg/dL  GLUCOSE, CAPILLARY     Status: Abnormal   Collection Time    02/03/14 12:11 AM      Result Value Ref Range   Glucose-Capillary 107 (*) 70 - 99 mg/dL  GLUCOSE, CAPILLARY     Status: None   Collection Time    02/03/14 12:57 AM      Result Value Ref Range   Glucose-Capillary 90  70 - 99 mg/dL  GLUCOSE, CAPILLARY     Status: None   Collection Time    02/03/14  1:54 AM      Result Value Ref Range   Glucose-Capillary 97  70 - 99 mg/dL  GLUCOSE, CAPILLARY     Status: None   Collection Time    02/03/14  3:35 AM      Result Value Ref Range   Glucose-Capillary 88  70 - 99 mg/dL   Comment 1 Documented in Chart     Comment 2 Notify RN    CBC     Status: Abnormal   Collection Time    02/03/14  4:09 AM      Result Value Ref Range   WBC 13.0 (*) 4.0 - 10.5 K/uL   RBC 3.59 (*) 4.22 - 5.81 MIL/uL   Hemoglobin 11.3 (*) 13.0 - 17.0 g/dL   HCT 32.6 (*) 39.0 - 52.0 %   MCV 90.8  78.0 - 100.0 fL   MCH 31.5  26.0 - 34.0 pg   MCHC 34.7  30.0 - 36.0 g/dL   RDW 13.2  11.5 - 15.5 %   Platelets 139 (*) 150 - 400 K/uL  BASIC METABOLIC PANEL     Status: Abnormal   Collection Time    02/03/14  4:09 AM      Result Value Ref Range   Sodium 139  137 - 147 mEq/L   Potassium 4.2  3.7 - 5.3 mEq/L   Chloride 104  96 - 112 mEq/L   CO2 21  19 - 32 mEq/L   Glucose, Bld 113 (*) 70 - 99 mg/dL   BUN 11  6 - 23 mg/dL   Creatinine, Ser 0.78  0.50 - 1.35 mg/dL   Calcium 7.7 (*) 8.4 - 10.5 mg/dL   GFR calc non Af Amer >90  >90 mL/min   GFR calc Af Amer >90  >90 mL/min   Comment: (NOTE)     The eGFR has been calculated using the CKD EPI equation.     This calculation has not been validated in all clinical situations.     eGFR's persistently <90 mL/min signify possible Chronic Kidney     Disease.  MAGNESIUM     Status: None   Collection Time    02/03/14  4:09 AM      Result Value Ref Range    Magnesium 2.3  1.5 - 2.5 mg/dL  GLUCOSE, CAPILLARY     Status: Abnormal   Collection Time    02/03/14  7:56 AM      Result Value Ref Range   Glucose-Capillary 103 (*) 70 - 99 mg/dL   Comment 1 Documented in Chart     Comment 2 Notify RN      Imaging: Imaging results have been reviewed  Assessment/Plan:   1. Active Problems: 2.   CAD (coronary artery disease) 3.   Time Spent Directly with Patient:  20 minutes  Length of Stay:  LOS: 2 days   POD # 1 CABG X 6 by Dr. Roxan Hockey. Paced. VSS. On neo with soft BP. Exam benign. Labs OK. Nl progression per [facility].  Lorretta Harp 02/03/2014, 9:19 AM

## 2014-02-03 NOTE — Progress Notes (Signed)
Patient refusing to wear CPAP tonight.  Wanting to hold off until he gets fitted for his for home use.  Was told if he changed his mind to call RT.

## 2014-02-03 NOTE — Progress Notes (Signed)
[facility] DAILY ICU PROGRESS NOTE                   [address]            [place] [address]          [phone]   1 Day Post-Op Procedure(s) (LRB): CORONARY ARTERY BYPASS GRAFTING (CABG) times six using bilateral mammaries and right saphenous vein. (N/A) INTRAOPERATIVE TRANSESOPHAGEAL ECHOCARDIOGRAM (N/A)  Total Length of Stay:  LOS: 2 days   Subjective: Feels good, no specific c/o  Objective: Vital signs in last 24 hours: Temp:  [96.6 F (35.9 C)-101.3 F (38.5 C)] 99.5 F (37.5 C) (02/12 0645) Pulse Rate:  [55-113] 80 (02/12 0645) Cardiac Rhythm:  [-] Atrial paced (02/12 0400) Resp:  [6-28] 15 (02/12 0645) BP: (79-100)/(52-67) 91/61 mmHg (02/12 0630) SpO2:  [91 %-100 %] 96 % (02/12 0645) FiO2 (%):  [40 %-50 %] 40 % (02/11 2000) Weight:  [227 lb 1.2 oz (103 kg)-234 lb 12.6 oz (106.5 kg)] 234 lb 12.6 oz (106.5 kg) (02/12 0500)  Filed Weights   02/01/14 1600 02/02/14 1500 02/03/14 0500  Weight: 227 lb 11.8 oz (103.3 kg) 227 lb 1.2 oz (103 kg) 234 lb 12.6 oz (106.5 kg)    Weight change: -3 lb 14.8 oz (-1.781 kg)   Hemodynamic parameters for last 24 hours: PAP: (22-41)/(9-23) 33/15 mmHg CO:  [4.9 L/min-6.4 L/min] 6.4 L/min CI:  [2.2 L/min/m2-2.8 L/min/m2] 2.8 L/min/m2  Intake/Output from previous day: 02/11 0701 - 02/12 0700 In: 6353.6 [P.O.:480; I.V.:3665.6; Blood:548; NG/GT:60; IV Piggyback:1600] Out: 4599 [Urine:3176; Emesis/NG output:50; Blood:875; Chest Tube:498]  Intake/Output this shift: Total I/O In: -  Out: 100 [Urine:100]  Current Meds: Scheduled Meds: . acetaminophen  1,000 mg Oral 4 times per day   Or  . acetaminophen (TYLENOL) oral liquid 160 mg/5 mL  1,000 mg Per Tube 4 times per day  . aspirin EC  325 mg Oral Daily   Or  . aspirin  324 mg Per Tube Daily  . atorvastatin  40 mg Oral q1800  . bisacodyl  10 mg Oral Daily   Or  . bisacodyl  10 mg Rectal Daily  . cefUROXime (ZINACEF)  IV  1.5 g Intravenous Q12H  . Chlorhexidine  Gluconate Cloth  6 each Topical Daily  . docusate sodium  200 mg Oral Daily  . famotidine (PEPCID) IV  20 mg Intravenous Q12H  . insulin aspart  0-24 Units Subcutaneous 6 times per day  . metoprolol tartrate  12.5 mg Oral BID   Or  . metoprolol tartrate  12.5 mg Per Tube BID  . mupirocin ointment  1 application Nasal BID  . [START ON 02/04/2014] pantoprazole  40 mg Oral Daily  . sodium chloride  3 mL Intravenous Q12H   Continuous Infusions: . sodium chloride 20 mL/hr (02/02/14 1538)  . sodium chloride 20 mL/hr (02/02/14 1515)  . sodium chloride    . dexmedetomidine Stopped (02/02/14 1655)  . lactated ringers 20 mL/hr (02/02/14 1542)  . nitroGLYCERIN Stopped (02/02/14 1515)  . phenylephrine (NEO-SYNEPHRINE) Adult infusion 40 mcg/min (02/03/14 0500)   PRN Meds:.metoprolol, midazolam, morphine injection, ondansetron (ZOFRAN) IV, oxyCODONE, sodium chloride  General appearance: alert, cooperative and no distress Heart: regular rate and rhythm and brady Lungs: clear anteriorly Abdomen: soft, non-tender Extremities: no edema Wound: dressings CDI  Lab Results: CBC: Recent Labs  02/02/14 2100 02/02/14 2119 02/03/14 0409  WBC 13.0*  --  13.0*  HGB 12.4* 12.2* 11.3*  HCT  35.7* 36.0* 32.6*  PLT 158  --  139*   BMET:  Recent Labs  02/02/14 0320  02/02/14 2119 02/03/14 0409  NA 141  < > 142 139  K 4.8  < > 4.3 4.2  CL 103  --  105 104  CO2 25  --   --  21  GLUCOSE 100*  < > 103* 113*  BUN 13  --  9 11  CREATININE 0.94  < > 0.90 0.78  CALCIUM 9.4  --   --  7.7*  < > = values in this interval not displayed.  PT/INR:  Recent Labs  02/02/14 1508  LABPROT 16.5*  INR 1.37   Radiology: Dg Chest Portable 1 View In Am  02/03/2014   CLINICAL DATA:  Postop  EXAM: PORTABLE CHEST - 1 VIEW  COMPARISON:  02/02/2014  FINDINGS: Interval withdrawal of the right IJ Swan-Ganz catheter, now terminating in the right main pulmonary artery.  Interval extubation and removal of enteric tube.  Mild patchy bilateral lower lobe opacities, likely atelectasis.  Bilateral chest tubes and mediastinal drain.  No pneumothorax.  Cardiomegaly. Postsurgical changes related to prior CABG.  IMPRESSION: Interval withdrawal of the right IJ Swan-Ganz catheter, now terminating in the right main pulmonary artery.  Additional support apparatus as above.  No pneumothorax.   Electronically Signed   By: Julian Hy M.D.   On: 02/03/2014 08:02   Dg Chest Portable 1 View  02/02/2014   CLINICAL DATA:  Postop coronary artery bypass grafting  EXAM: PORTABLE CHEST - 1 VIEW  COMPARISON:  DG CHEST 2 VIEW dated 01/31/2014  FINDINGS: Low lung volumes. Chest tubes are appreciated within the lung bases. There is no evidence of a pneumothorax. Patient is status post recent median sternotomy and coronary artery bypass grafting. A Swan-Ganz catheter is appreciated with tip projecting in the distal region of the right pulmonary artery approximately 8-9 cm from the central hilar region. Retraction of approximately 8 cm is recommended. A mediastinal drain is identified. An endotracheal tube is appreciated with tip at the level of the clavicles. A NG tube is seen with tip projecting in the region of the stomach. With technique taking into consideration the lungs are clear. The osseous structures are unremarkable.  The Swan-Ganz catheter findings were discussed with the patient's floor nurse Ruel Favors who reported that the patient's attending physician retracted the Swan-Ganz catheter after this radiograph. A repeat revaluation chest radiograph is recommended.  IMPRESSION: 1. Swan-Ganz catheter tip projecting approximately 8-9 cm distal to the central right hilar region. Retraction of approximately of the catheter approximately 8 cm is recommended. These results were called by telephone at the time of interpretation on 02/02/2014 at 4:09 PM to Ruel Favors the patient's floor nurse , who verbally acknowledged these results. She stated that  the patient's attending physician was aware of the catheter positioning and retracted the catheter after this radiograph was obtained. 2. Remaining support lines and tubes as described above. There is otherwise no evidence of acute cardiopulmonary disease.   Electronically Signed   By: Margaree Mackintosh M.D.   On: 02/02/2014 16:12     Assessment/Plan: S/P Procedure(s) (LRB): CORONARY ARTERY BYPASS GRAFTING (CABG) times six using bilateral mammaries and right saphenous vein. (N/A) INTRAOPERATIVE TRANSESOPHAGEAL ECHOCARDIOGRAM (N/A) 1 excellent progress 2 d/c chest tubes 3 gentle diuresis 4 cont a pace for bradycardia, hold beta blocker for now 5 wean neo as able 6 expected ABL anemia- monitor 7 mild fever/leukocytosis- push pulm toilet 8  see orders  GOLD,WAYNE E 02/03/2014 8:09 AM

## 2014-02-03 NOTE — Op Note (Signed)
NAME:  [name] West, KINDSCHI NO.:  [id number]  MEDICAL RECORD NO.:  [id number]  LOCATION:  2S03C                        FACILITY:  [facility]  PHYSICIAN:  Revonda Standard. Roxan Hockey, M.D.DATE OF BIRTH:  Sep 02, 1951  DATE OF PROCEDURE:  02/02/2014 DATE OF DISCHARGE:                              OPERATIVE REPORT   PREOPERATIVE DIAGNOSIS:  Left main and three-vessel coronary artery disease.  POSTOPERATIVE DIAGNOSIS:  Left main and three-vessel coronary artery disease.  PROCEDURE:  Median sternotomy, extracorporeal circulation, coronary artery bypass grafting x6 (left internal mammary artery to LAD, free right internal mammary artery to obtuse marginal 1, sequential saphenous vein graft to the second and third diagonals, sequential saphenous vein graft to distal right coronary and posterior descending), endoscopic vein harvest right thigh.  SURGEON:  Revonda Standard. Roxan Hockey, M.D.  ASSISTANT:  Lars Pinks, PA-C  ANESTHESIA:  General.  FINDINGS:  Transesophageal echocardiography revealed normal left ventricular function with no valvular pathology.  Good quality conduits and good quality targets.  CLINICAL NOTE:  [name] West is a 63 year old gentleman with multiple cardiac risk factors who noticed more frequent palpitations and early fatigue.  He was evaluated with a stress test which was markedly positive.  At cardiac catheterization, he had severe left main and three- vessel disease.  He was referred for coronary artery bypass grafting. The indications, risks, benefits, and alternatives were discussed in detail with the patient and his wife.  He accepted the risks and agreed to proceed.  OPERATIVE NOTE:  [name] West was brought to the preoperative holding area on February 02, 2014.  There Anesthesia placed an arterial blood pressure monitoring line and a Swan-Ganz catheter.  He was taken to the operating room, anesthetized, and intubated.  A Foley catheter  was placed.  Intravenous antibiotics were administered.  The chest, abdomen, and legs were prepped and draped in the usual sterile fashion.  An incision was made in the right leg at the level of the knee.  The greater saphenous vein was identified and was harvested endoscopically from upper calf to groin.  It was a good quality conduit.  2000 units of heparin was administered during the vessel harvest.  Simultaneously with the vein harvest, a median sternotomy was performed.  The left internal mammary artery was harvested using standard technique.  It was a good quality vessel.  The right internal mammary artery then was harvested again using standard technique and it likewise was a good quality vessel.  It was planned to use the RIMA to OM1. It did not appear to have sufficient length to be used as a pedicle graft through the transverse sinus.  Therefore, at the completion of the harvest, it was divided proximally and the proximal stump was suture ligated with a 2-0 silk.  The remainder of the full heparin dose was given.  The pericardium was opened.  The ascending aorta was inspected.  It was of normal caliber.  There was no palpable atherosclerotic disease.  The aorta was cannulated via concentric 2-0 Ethibond pledgeted pursestring sutures.  A dual-stage venous cannula was placed via a pursestring suture in the right atrial appendage.  After confirming adequate anticoagulation with ACT measurement, cardiopulmonary bypass was instituted.  Flows were  maintained per protocol.  The coronary arteries were inspected and anastomotic sites were chosen.  The conduits were inspected and cut to length.  A foam pad was placed in the pericardium to insulate the heart and protect the left phrenic nerve.  A temperature probe was placed in the myocardial septum and a cardioplegic cannula was placed in the ascending aorta.  The aorta was crossclamped.  The left ventricle was emptied via the aortic  root vent.  Cardiac arrest then was achieved with a combination of cold antegrade blood cardioplegia and topical iced saline.  1.5 L of cardioplegia was administered.  There was a rapid diastolic arrest, and there was ultimately septal cooling to 10 degrees Celsius.  The following distal anastomoses were performed:  First, a reversed saphenous vein graft was placed sequentially to the distal right coronary and posterior descending.  The right coronary was a large vessel that gave off the posterior descending branch and then gave off several posterolaterals.  There was significant disease at the origin of the posterior descending with some small aneurysms, therefore, it required sequential grafting to get to the more distal portion of the vessel.  The distal right coronary was a 2.5 mm good quality target. The probe did pass easily into the distal branches.  The posterior descending was a 1.5 mm good quality target vessel.  A side-to-side anastomosis was performed to the distal right and an end-to-side to the posterior descending both were done with running 7-0 Prolene sutures. All anastomoses were probed proximally and distally at their completion to ensure patency before tying the sutures.  Cardioplegia was administered at the completion of each vein graft to assess flow and hemostasis.  Next, a reversed saphenous vein graft was placed sequentially to the second and third diagonal branch of the LAD.  The LAD gave off 4 diagonal branches.  Three of these were proximal to the total occlusion. The first one was a smaller vessel.  The second and third were the larger of the branches, of those the second was of better quality than the third.  The third was intramyocardial.  A side-to-side anastomosis was performed to the diagonal 2 and an end-to-side to diagonal 3 both were done with running 7-0 Prolene sutures.  At the completion of the graft, there was excellent flow through the graft  and good hemostasis at both anastomoses, although there was some bleeding from the myocardium at the diagonal 3.  Next, the heart was elevated exposing the obtuse marginal branches. There was significant disease in the circumflex which then gave rise to a large OM1.  OM2 was a smaller vessel and not graftable.  OM1 was a 1.5 mm good quality target.  The distal end of the free right mammary graft was beveled and was anastomosed end-to-side to the OM with a running 8-0 Prolene suture.  At the completion of the graft, cardioplegia was administered.  There was good backbleeding from the right mammary graft. The bulldog clamp was placed across proximally.  Additional cardioplegia was also administered down the aortic root.  Next, the left internal mammary artery was brought through a window in the pericardium.  The distal end was beveled.  It was then anastomosed end-to-side to the distal LAD.  The LAD was a 2 mm good quality target. The mammary was a 2 mm good quality conduit.  The end-to-side anastomosis was performed with a running 8-0 Prolene suture.  At the completion of the mammary to LAD anastomosis, the bulldog clamp was  briefly removed to inspect for hemostasis.  Immediate and rapid septal rewarming was noted.  The bulldog clamp was replaced and the mammary pedicle was tacked to the epicardial surface of the heart with 6-0 Prolene sutures.  Again, additional cardioplegia was administered down the aortic root. The vein grafts were cut to length.  The proximal vein graft anastomoses then were performed to 4.0 mm punch aortotomies with running 6-0 Prolene sutures.  At the completion of the final vein graft proximal anastomosis, the patient was placed in Trendelenburg.  Lidocaine was administered.  The bulldog clamp was removed from the left mammary artery.  The aortic root was de-aired and the aortic crossclamp was removed.  The total crossclamp time was 97 minutes.  The patient  did initially fibrillate. After a single defibrillation with 10 joules he was in sinus bradycardia.  Bulldog clamps were placed proximally and distally on the saphenous vein to the diagonals.  A longitudinal venotomy was made just beyond its proximal anastomosis to the aorta.  The proximal end of the free right mammary graft then was anastomosed end-to-side to this with a running 7- 0 Prolene suture.  At completion of the anastomosis, the right mammary and vein were allowed to backbleed to de-air the anastomosis before tying the suture.  While rewarming was completed, all proximal and distal anastomoses were inspected for hemostasis.  Epicardial pacing wires were placed on the right ventricle and the right atrium and DDD pacing at 80 beats per minute was initiated due to bradycardia.  When the patient had reached a core temperature of 37 degrees Celsius, he was weaned from cardiopulmonary bypass on the first attempt.  Total bypass time was 154 minutes.  The initial cardiac index was greater than 3 L/min/m2 and the patient remained hemodynamically stable throughout the postbypass period.  Postbypass transesophageal echocardiography revealed no valvular disease and preserved left ventricular function.  A test dose of protamine was administered and was well tolerated.  The atrial and aortic cannulae were removed.  The remainder of the protamine was administered without incident.  The chest was irrigated with warm saline.  Hemostasis was achieved.  The pericardium was reapproximated over the aortic root and base of the heart with interrupted 3-0 silk sutures.  It came together easily without tension.  Bilateral pleural tubes were placed.  Then, a single mediastinal tube was placed in the anterior mediastinum.  These were all placed through a separate subcostal incisions and secured with #1 silk sutures.  After a final inspection for hemostasis, the sternum was closed with a combination  of single and double heavy gauge stainless steel wires.  The pectoralis fascia, subcutaneous tissue, and skin were closed in standard fashion.  All sponge, needle, and instrument counts were correct at the end of the procedure.  The patient was taken from the operating room to the surgical intensive care unit in good condition.     Revonda Standard Roxan Hockey, M.D.     SCH/MEDQ  D:  02/02/2014  T:  02/03/2014  Job:  [id number]

## 2014-02-04 ENCOUNTER — Inpatient Hospital Stay (HOSPITAL_COMMUNITY): Payer: BC Managed Care – PPO

## 2014-02-04 LAB — GLUCOSE, CAPILLARY
Glucose-Capillary: 104 mg/dL — ABNORMAL HIGH (ref 70–99)
Glucose-Capillary: 124 mg/dL — ABNORMAL HIGH (ref 70–99)
Glucose-Capillary: 125 mg/dL — ABNORMAL HIGH (ref 70–99)
Glucose-Capillary: 94 mg/dL (ref 70–99)
Glucose-Capillary: 92 mg/dL (ref 70–99)

## 2014-02-04 LAB — CBC
HEMATOCRIT: 30.5 % — AB (ref 39.0–52.0)
HEMOGLOBIN: 10.6 g/dL — AB (ref 13.0–17.0)
MCH: 31.6 pg (ref 26.0–34.0)
MCHC: 34.8 g/dL (ref 30.0–36.0)
MCV: 91 fL (ref 78.0–100.0)
Platelets: 109 10*3/uL — ABNORMAL LOW (ref 150–400)
RBC: 3.35 MIL/uL — AB (ref 4.22–5.81)
RDW: 13.3 % (ref 11.5–15.5)
WBC: 11.5 10*3/uL — AB (ref 4.0–10.5)

## 2014-02-04 LAB — BASIC METABOLIC PANEL
BUN: 15 mg/dL (ref 6–23)
CHLORIDE: 101 meq/L (ref 96–112)
CO2: 23 meq/L (ref 19–32)
Calcium: 8.1 mg/dL — ABNORMAL LOW (ref 8.4–10.5)
Creatinine, Ser: 0.81 mg/dL (ref 0.50–1.35)
GFR calc Af Amer: >90 mL/min (ref >90)
GFR calc non Af Amer: >90 mL/min (ref >90)
Glucose, Bld: 120 mg/dL — ABNORMAL HIGH (ref 70–99)
POTASSIUM: 4.2 meq/L (ref 3.7–5.3)
Sodium: 137 mEq/L (ref 137–147)

## 2014-02-04 MED ORDER — SODIUM CHLORIDE 0.9 % IJ SOLN
3.0000 mL | Freq: Two times a day (BID) | INTRAMUSCULAR | Status: DC
  Administered 2014-02-04 – 2014-02-06 (×5): 3 mL via INTRAVENOUS

## 2014-02-04 MED ORDER — CALCIUM CARBONATE ANTACID 500 MG PO CHEW
400.0000 mg | CHEWABLE_TABLET | Freq: Four times a day (QID) | ORAL | Status: DC | PRN
  Filled 2014-02-04: qty 2

## 2014-02-04 MED ORDER — METOPROLOL SUCCINATE 12.5 MG HALF TABLET
12.5000 mg | ORAL_TABLET | Freq: Every day | ORAL | Status: DC
  Administered 2014-02-04 – 2014-02-06 (×3): 12.5 mg via ORAL
  Filled 2014-02-04 (×5): qty 1

## 2014-02-04 MED ORDER — ALUM & MAG HYDROXIDE-SIMETH 200-200-20 MG/5ML PO SUSP
15.0000 mL | ORAL | Status: DC | PRN
  Administered 2014-02-04: 30 mL via ORAL
  Filled 2014-02-04: qty 30

## 2014-02-04 MED ORDER — ZOLPIDEM TARTRATE 5 MG PO TABS: 10.0000 mg | ORAL_TABLET | Freq: Every evening | ORAL | Status: DC | PRN

## 2014-02-04 MED ORDER — GUAIFENESIN-DM 100-10 MG/5ML PO SYRP: 15.0000 mL | ORAL_SOLUTION | ORAL | Status: DC | PRN

## 2014-02-04 MED ORDER — MOVING RIGHT ALONG BOOK
Freq: Once | Status: AC
  Administered 2014-02-05: 09:00:00
  Filled 2014-02-04: qty 1

## 2014-02-04 MED ORDER — POTASSIUM CHLORIDE CRYS ER 20 MEQ PO TBCR
20.0000 meq | EXTENDED_RELEASE_TABLET | Freq: Two times a day (BID) | ORAL | Status: DC
  Administered 2014-02-04 – 2014-02-07 (×7): 20 meq via ORAL
  Filled 2014-02-04 (×8): qty 1

## 2014-02-04 MED ORDER — FUROSEMIDE 40 MG PO TABS
40.0000 mg | ORAL_TABLET | Freq: Every day | ORAL | Status: DC
  Administered 2014-02-04 – 2014-02-07 (×4): 40 mg via ORAL
  Filled 2014-02-04 (×4): qty 1

## 2014-02-04 MED ORDER — ALUM & MAG HYDROXIDE-SIMETH 200-200-20 MG/5ML PO SUSP: 30.0000 mL | Freq: Four times a day (QID) | ORAL | Status: DC | PRN

## 2014-02-04 MED ORDER — SODIUM CHLORIDE 0.9 % IV SOLN: 250.0000 mL | INTRAVENOUS | Status: DC | PRN

## 2014-02-04 MED ORDER — MAGNESIUM HYDROXIDE 400 MG/5ML PO SUSP: 30.0000 mL | Freq: Every day | ORAL | Status: DC | PRN

## 2014-02-04 MED ORDER — SODIUM CHLORIDE 0.9 % IJ SOLN: 3.0000 mL | INTRAMUSCULAR | Status: DC | PRN

## 2014-02-04 MED FILL — Magnesium Sulfate Inj 50%: INTRAMUSCULAR | Qty: 10 | Status: AC

## 2014-02-04 MED FILL — Potassium Chloride Inj 2 mEq/ML: INTRAVENOUS | Qty: 40 | Status: AC

## 2014-02-04 MED FILL — Dexmedetomidine HCl IV Soln 200 MCG/2ML: INTRAVENOUS | Qty: 2 | Status: AC

## 2014-02-04 MED FILL — Heparin Sodium (Porcine) Inj 1000 Unit/ML: INTRAMUSCULAR | Qty: 30 | Status: AC

## 2014-02-04 NOTE — Progress Notes (Signed)
Report called to [place], 2W-RN.

## 2014-02-04 NOTE — Progress Notes (Signed)
Pt transferred to 2W-18 via ambulation, no O2, VS stable at time of transfer. Belongings with pt, family aware of room change. Meds in chart. No c/o pain. No questions or complaints at this time. Bedside handoff to Deana, 2W-RN. Alynah Schone L

## 2014-02-04 NOTE — Progress Notes (Signed)
Subjective:  No CP/SOB, POD # 2  Objective:  Temp:  [97.5 F (36.4 C)-100.4 F (38 C)] 98.1 F (36.7 C) (02/13 0748) Pulse Rate:  [78-86] 81 (02/13 0800) Resp:  [13-28] 19 (02/13 0800) BP: (88-130)/(54-69) 115/62 mmHg (02/13 0800) SpO2:  [93 %-100 %] 96 % (02/13 0800) Weight:  [238 lb 1.6 oz (108 kg)] 238 lb 1.6 oz (108 kg) (02/13 0500) Weight change: 11 lb 0.4 oz (5 kg)  Intake/Output from previous day: 02/12 0701 - 02/13 0700 In: 732.5 [P.O.:440; I.V.:192.5; IV Piggyback:100] Out: 1500 [Urine:1380; Chest Tube:120]  Intake/Output from this shift: Total I/O In: -  Out: 100 [Urine:100]  Physical Exam: General appearance: alert and no distress Neck: no adenopathy, no carotid bruit, no JVD, supple, symmetrical, trachea midline and thyroid not enlarged, symmetric, no tenderness/mass/nodules Lungs: clear to auscultation bilaterally Heart: regular rate and rhythm, S1, S2 normal, no murmur, click, rub or gallop Extremities: extremities normal, atraumatic, no cyanosis or edema  Lab Results: Results for orders placed during the hospital encounter of 02/01/14 (from the past 48 hour(s))  POCT I-STAT GLUCOSE     Status: Abnormal   Collection Time    02/02/14 10:28 AM      Result Value Ref Range   Operator id [id number]     Glucose, Bld 127 (*) 70 - 99 mg/dL  POCT I-STAT 4, (NA,K, GLUC, HGB,HCT)     Status: Abnormal   Collection Time    02/02/14 10:49 AM      Result Value Ref Range   Sodium 140  137 - 147 mEq/L   Potassium 4.2  3.7 - 5.3 mEq/L   Glucose, Bld 136 (*) 70 - 99 mg/dL   HCT 36.0 (*) 39.0 - 52.0 %   Hemoglobin 12.2 (*) 13.0 - 17.0 g/dL  POCT I-STAT 4, (NA,K, GLUC, HGB,HCT)     Status: Abnormal   Collection Time    02/02/14 11:30 AM      Result Value Ref Range   Sodium 136 (*) 137 - 147 mEq/L   Potassium 3.9  3.7 - 5.3 mEq/L   Glucose, Bld 128 (*) 70 - 99 mg/dL   HCT 29.0 (*) 39.0 - 52.0 %   Hemoglobin 9.9 (*) 13.0 - 17.0 g/dL  POCT I-STAT 3, BLOOD GAS  (G3+)     Status: Abnormal   Collection Time    02/02/14 11:34 AM      Result Value Ref Range   pH, Arterial 7.398  7.350 - 7.450   pCO2 arterial 42.0  35.0 - 45.0 mmHg   pO2, Arterial 316.0 (*) 80.0 - 100.0 mmHg   Bicarbonate 25.9 (*) 20.0 - 24.0 mEq/L   TCO2 27  0 - 100 mmol/L   O2 Saturation 100.0     Acid-Base Excess 1.0  0.0 - 2.0 mmol/L   Sample type ARTERIAL    POCT I-STAT 4, (NA,K, GLUC, HGB,HCT)     Status: Abnormal   Collection Time    02/02/14 12:34 PM      Result Value Ref Range   Sodium 137  137 - 147 mEq/L   Potassium 4.5  3.7 - 5.3 mEq/L   Glucose, Bld 147 (*) 70 - 99 mg/dL   HCT 32.0 (*) 39.0 - 52.0 %   Hemoglobin 10.9 (*) 13.0 - 17.0 g/dL  HEMOGLOBIN AND HEMATOCRIT, BLOOD     Status: Abnormal   Collection Time    02/02/14 12:50 PM      Result Value Ref  Range   Hemoglobin 11.0 (*) 13.0 - 17.0 g/dL   Comment: REPEATED TO VERIFY     RESULT CALLED TO, READ BACK BY AND VERIFIED WITH:     WAGONER,T RN @ 1308 02/02/14 LEONARD,A   HCT 30.7 (*) 39.0 - 52.0 %   Comment: REPEATED TO VERIFY     RESULT CALLED TO, READ BACK BY AND VERIFIED WITH:     WAGONER,T RN @ 6283 02/02/14 LEONARD,A  PLATELET COUNT     Status: None   Collection Time    02/02/14 12:50 PM      Result Value Ref Range   Platelets 152  150 - 400 K/uL   Comment: REPEATED TO VERIFY     RESULT CALLED TO, READ BACK BY AND VERIFIED WITH:     WAGONER,T RN @ 1517 02/02/14 LEONARD,A  POCT I-STAT 4, (NA,K, GLUC, HGB,HCT)     Status: Abnormal   Collection Time    02/02/14  1:30 PM      Result Value Ref Range   Sodium 139  137 - 147 mEq/L   Potassium 4.8  3.7 - 5.3 mEq/L   Glucose, Bld 144 (*) 70 - 99 mg/dL   HCT 33.0 (*) 39.0 - 52.0 %   Hemoglobin 11.2 (*) 13.0 - 17.0 g/dL  POCT I-STAT 4, (NA,K, GLUC, HGB,HCT)     Status: Abnormal   Collection Time    02/02/14  2:06 PM      Result Value Ref Range   Sodium 139  137 - 147 mEq/L   Potassium 4.6  3.7 - 5.3 mEq/L   Glucose, Bld 156 (*) 70 - 99 mg/dL   HCT  32.0 (*) 39.0 - 52.0 %   Hemoglobin 10.9 (*) 13.0 - 17.0 g/dL  POCT I-STAT 3, BLOOD GAS (G3+)     Status: Abnormal   Collection Time    02/02/14  2:10 PM      Result Value Ref Range   pH, Arterial 7.415  7.350 - 7.450   pCO2 arterial 38.0  35.0 - 45.0 mmHg   pO2, Arterial 87.0  80.0 - 100.0 mmHg   Bicarbonate 24.4 (*) 20.0 - 24.0 mEq/L   TCO2 26  0 - 100 mmol/L   O2 Saturation 97.0     Sample type ARTERIAL    CBC     Status: Abnormal   Collection Time    02/02/14  3:08 PM      Result Value Ref Range   WBC 13.0 (*) 4.0 - 10.5 K/uL   RBC 4.16 (*) 4.22 - 5.81 MIL/uL   Hemoglobin 13.1  13.0 - 17.0 g/dL   Comment: REPEATED TO VERIFY     DELTA CHECK NOTED   HCT 37.1 (*) 39.0 - 52.0 %   MCV 89.2  78.0 - 100.0 fL   MCH 31.5  26.0 - 34.0 pg   MCHC 35.3  30.0 - 36.0 g/dL   RDW 13.0  11.5 - 15.5 %   Platelets 126 (*) 150 - 400 K/uL  PROTIME-INR     Status: Abnormal   Collection Time    02/02/14  3:08 PM      Result Value Ref Range   Prothrombin Time 16.5 (*) 11.6 - 15.2 seconds   INR 1.37  0.00 - 1.49  APTT     Status: None   Collection Time    02/02/14  3:08 PM      Result Value Ref Range   aPTT 28  24 - 37  seconds  POCT I-STAT 4, (NA,K, GLUC, HGB,HCT)     Status: Abnormal   Collection Time    02/02/14  3:18 PM      Result Value Ref Range   Sodium 142  137 - 147 mEq/L   Potassium 3.8  3.7 - 5.3 mEq/L   Glucose, Bld 116 (*) 70 - 99 mg/dL   HCT 36.0 (*) 39.0 - 52.0 %   Hemoglobin 12.2 (*) 13.0 - 17.0 g/dL  POCT I-STAT 3, BLOOD GAS (G3+)     Status: None   Collection Time    02/02/14  3:18 PM      Result Value Ref Range   pH, Arterial 7.352  7.350 - 7.450   pCO2 arterial 42.5  35.0 - 45.0 mmHg   pO2, Arterial 81.0  80.0 - 100.0 mmHg   Bicarbonate 23.9  20.0 - 24.0 mEq/L   TCO2 25  0 - 100 mmol/L   O2 Saturation 96.0     Acid-base deficit 2.0  0.0 - 2.0 mmol/L   Patient temperature 36.0 C     Collection site ARTERIAL LINE     Drawn by Nurse     Sample type ARTERIAL      GLUCOSE, CAPILLARY     Status: Abnormal   Collection Time    02/02/14  4:30 PM      Result Value Ref Range   Glucose-Capillary 102 (*) 70 - 99 mg/dL  GLUCOSE, CAPILLARY     Status: None   Collection Time    02/02/14  5:35 PM      Result Value Ref Range   Glucose-Capillary 94  70 - 99 mg/dL  GLUCOSE, CAPILLARY     Status: Abnormal   Collection Time    02/02/14  6:42 PM      Result Value Ref Range   Glucose-Capillary 104 (*) 70 - 99 mg/dL  POCT I-STAT 3, BLOOD GAS (G3+)     Status: Abnormal   Collection Time    02/02/14  6:46 PM      Result Value Ref Range   pH, Arterial 7.273 (*) 7.350 - 7.450   pCO2 arterial 53.1 (*) 35.0 - 45.0 mmHg   pO2, Arterial 102.0 (*) 80.0 - 100.0 mmHg   Bicarbonate 24.6 (*) 20.0 - 24.0 mEq/L   TCO2 26  0 - 100 mmol/L   O2 Saturation 97.0     Acid-base deficit 3.0 (*) 0.0 - 2.0 mmol/L   Patient temperature 36.9 C     Collection site ARTERIAL LINE     Drawn by Operator     Sample type ARTERIAL    GLUCOSE, CAPILLARY     Status: Abnormal   Collection Time    02/02/14  7:57 PM      Result Value Ref Range   Glucose-Capillary 121 (*) 70 - 99 mg/dL  POCT I-STAT 3, BLOOD GAS (G3+)     Status: Abnormal   Collection Time    02/02/14  7:59 PM      Result Value Ref Range   pH, Arterial 7.304 (*) 7.350 - 7.450   pCO2 arterial 50.4 (*) 35.0 - 45.0 mmHg   pO2, Arterial 97.0  80.0 - 100.0 mmHg   Bicarbonate 24.8 (*) 20.0 - 24.0 mEq/L   TCO2 26  0 - 100 mmol/L   O2 Saturation 96.0     Acid-base deficit 2.0  0.0 - 2.0 mmol/L   Patient temperature 37.8 C     Sample type ARTERIAL  CBC     Status: Abnormal   Collection Time    02/02/14  9:00 PM      Result Value Ref Range   WBC 13.0 (*) 4.0 - 10.5 K/uL   RBC 3.94 (*) 4.22 - 5.81 MIL/uL   Hemoglobin 12.4 (*) 13.0 - 17.0 g/dL   HCT 35.7 (*) 39.0 - 52.0 %   MCV 90.6  78.0 - 100.0 fL   MCH 31.5  26.0 - 34.0 pg   MCHC 34.7  30.0 - 36.0 g/dL   RDW 13.1  11.5 - 15.5 %   Platelets 158  150 - 400 K/uL   MAGNESIUM     Status: Abnormal   Collection Time    02/02/14  9:00 PM      Result Value Ref Range   Magnesium 3.0 (*) 1.5 - 2.5 mg/dL  CREATININE, SERUM     Status: None   Collection Time    02/02/14  9:00 PM      Result Value Ref Range   Creatinine, Ser 0.83  0.50 - 1.35 mg/dL   GFR calc non Af Amer >90  >90 mL/min   GFR calc Af Amer >90  >90 mL/min   Comment: (NOTE)     The eGFR has been calculated using the CKD EPI equation.     This calculation has not been validated in all clinical situations.     eGFR's persistently <90 mL/min signify possible Chronic Kidney     Disease.  GLUCOSE, CAPILLARY     Status: Abnormal   Collection Time    02/02/14  9:11 PM      Result Value Ref Range   Glucose-Capillary 116 (*) 70 - 99 mg/dL  POCT I-STAT 3, BLOOD GAS (G3+)     Status: Abnormal   Collection Time    02/02/14  9:14 PM      Result Value Ref Range   pH, Arterial 7.342 (*) 7.350 - 7.450   pCO2 arterial 43.1  35.0 - 45.0 mmHg   pO2, Arterial 95.0  80.0 - 100.0 mmHg   Bicarbonate 23.4  20.0 - 24.0 mEq/L   TCO2 25  0 - 100 mmol/L   O2 Saturation 97.0     Acid-base deficit 2.0  0.0 - 2.0 mmol/L   Sample type ARTERIAL    POCT I-STAT, CHEM 8     Status: Abnormal   Collection Time    02/02/14  9:19 PM      Result Value Ref Range   Sodium 142  137 - 147 mEq/L   Potassium 4.3  3.7 - 5.3 mEq/L   Chloride 105  96 - 112 mEq/L   BUN 9  6 - 23 mg/dL   Creatinine, Ser 0.90  0.50 - 1.35 mg/dL   Glucose, Bld 103 (*) 70 - 99 mg/dL   Calcium, Ion 1.14  1.13 - 1.30 mmol/L   TCO2 23  0 - 100 mmol/L   Hemoglobin 12.2 (*) 13.0 - 17.0 g/dL   HCT 36.0 (*) 39.0 - 52.0 %  GLUCOSE, CAPILLARY     Status: None   Collection Time    02/02/14 10:11 PM      Result Value Ref Range   Glucose-Capillary 97  70 - 99 mg/dL  GLUCOSE, CAPILLARY     Status: Abnormal   Collection Time    02/02/14 11:08 PM      Result Value Ref Range   Glucose-Capillary 133 (*) 70 - 99 mg/dL  GLUCOSE, CAPILLARY  Status:  Abnormal   Collection Time    02/03/14 12:11 AM      Result Value Ref Range   Glucose-Capillary 107 (*) 70 - 99 mg/dL  GLUCOSE, CAPILLARY     Status: None   Collection Time    02/03/14 12:57 AM      Result Value Ref Range   Glucose-Capillary 90  70 - 99 mg/dL  GLUCOSE, CAPILLARY     Status: None   Collection Time    02/03/14  1:54 AM      Result Value Ref Range   Glucose-Capillary 97  70 - 99 mg/dL  GLUCOSE, CAPILLARY     Status: None   Collection Time    02/03/14  3:35 AM      Result Value Ref Range   Glucose-Capillary 88  70 - 99 mg/dL   Comment 1 Documented in Chart     Comment 2 Notify RN    CBC     Status: Abnormal   Collection Time    02/03/14  4:09 AM      Result Value Ref Range   WBC 13.0 (*) 4.0 - 10.5 K/uL   RBC 3.59 (*) 4.22 - 5.81 MIL/uL   Hemoglobin 11.3 (*) 13.0 - 17.0 g/dL   HCT 32.6 (*) 39.0 - 52.0 %   MCV 90.8  78.0 - 100.0 fL   MCH 31.5  26.0 - 34.0 pg   MCHC 34.7  30.0 - 36.0 g/dL   RDW 13.2  11.5 - 15.5 %   Platelets 139 (*) 150 - 400 K/uL  BASIC METABOLIC PANEL     Status: Abnormal   Collection Time    02/03/14  4:09 AM      Result Value Ref Range   Sodium 139  137 - 147 mEq/L   Potassium 4.2  3.7 - 5.3 mEq/L   Chloride 104  96 - 112 mEq/L   CO2 21  19 - 32 mEq/L   Glucose, Bld 113 (*) 70 - 99 mg/dL   BUN 11  6 - 23 mg/dL   Creatinine, Ser 0.78  0.50 - 1.35 mg/dL   Calcium 7.7 (*) 8.4 - 10.5 mg/dL   GFR calc non Af Amer >90  >90 mL/min   GFR calc Af Amer >90  >90 mL/min   Comment: (NOTE)     The eGFR has been calculated using the CKD EPI equation.     This calculation has not been validated in all clinical situations.     eGFR's persistently <90 mL/min signify possible Chronic Kidney     Disease.  MAGNESIUM     Status: None   Collection Time    02/03/14  4:09 AM      Result Value Ref Range   Magnesium 2.3  1.5 - 2.5 mg/dL  GLUCOSE, CAPILLARY     Status: Abnormal   Collection Time    02/03/14  7:56 AM      Result Value Ref Range    Glucose-Capillary 103 (*) 70 - 99 mg/dL   Comment 1 Documented in Chart     Comment 2 Notify RN    GLUCOSE, CAPILLARY     Status: Abnormal   Collection Time    02/03/14 12:29 PM      Result Value Ref Range   Glucose-Capillary 111 (*) 70 - 99 mg/dL   Comment 1 Documented in Chart     Comment 2 Notify RN    GLUCOSE, CAPILLARY     Status: Abnormal  Collection Time    02/03/14  4:48 PM      Result Value Ref Range   Glucose-Capillary 122 (*) 70 - 99 mg/dL   Comment 1 Documented in Chart     Comment 2 Notify RN    MAGNESIUM     Status: None   Collection Time    02/03/14  5:00 PM      Result Value Ref Range   Magnesium 2.5  1.5 - 2.5 mg/dL  CBC     Status: Abnormal   Collection Time    02/03/14  5:00 PM      Result Value Ref Range   WBC 12.3 (*) 4.0 - 10.5 K/uL   RBC 3.57 (*) 4.22 - 5.81 MIL/uL   Hemoglobin 11.1 (*) 13.0 - 17.0 g/dL   HCT 32.6 (*) 39.0 - 52.0 %   MCV 91.3  78.0 - 100.0 fL   MCH 31.1  26.0 - 34.0 pg   MCHC 34.0  30.0 - 36.0 g/dL   RDW 13.4  11.5 - 15.5 %   Platelets 122 (*) 150 - 400 K/uL  CREATININE, SERUM     Status: None   Collection Time    02/03/14  5:00 PM      Result Value Ref Range   Creatinine, Ser 0.86  0.50 - 1.35 mg/dL   GFR calc non Af Amer >90  >90 mL/min   GFR calc Af Amer >90  >90 mL/min   Comment: (NOTE)     The eGFR has been calculated using the CKD EPI equation.     This calculation has not been validated in all clinical situations.     eGFR's persistently <90 mL/min signify possible Chronic Kidney     Disease.  POCT I-STAT, CHEM 8     Status: Abnormal   Collection Time    02/03/14  5:29 PM      Result Value Ref Range   Sodium 137  137 - 147 mEq/L   Potassium 4.2  3.7 - 5.3 mEq/L   Chloride 98  96 - 112 mEq/L   BUN 13  6 - 23 mg/dL   Creatinine, Ser 0.90  0.50 - 1.35 mg/dL   Glucose, Bld 129 (*) 70 - 99 mg/dL   Calcium, Ion 1.19  1.13 - 1.30 mmol/L   TCO2 25  0 - 100 mmol/L   Hemoglobin 11.9 (*) 13.0 - 17.0 g/dL   HCT 35.0 (*)  39.0 - 52.0 %  GLUCOSE, CAPILLARY     Status: Abnormal   Collection Time    02/03/14  7:40 PM      Result Value Ref Range   Glucose-Capillary 113 (*) 70 - 99 mg/dL   Comment 1 Documented in Chart     Comment 2 Notify RN    GLUCOSE, CAPILLARY     Status: Abnormal   Collection Time    02/03/14 11:42 PM      Result Value Ref Range   Glucose-Capillary 108 (*) 70 - 99 mg/dL   Comment 1 Documented in Chart     Comment 2 Notify RN    GLUCOSE, CAPILLARY     Status: Abnormal   Collection Time    02/04/14  3:50 AM      Result Value Ref Range   Glucose-Capillary 104 (*) 70 - 99 mg/dL   Comment 1 Documented in Chart     Comment 2 Notify RN    BASIC METABOLIC PANEL     Status: Abnormal  Collection Time    02/04/14  4:10 AM      Result Value Ref Range   Sodium 137  137 - 147 mEq/L   Potassium 4.2  3.7 - 5.3 mEq/L   Chloride 101  96 - 112 mEq/L   CO2 23  19 - 32 mEq/L   Glucose, Bld 120 (*) 70 - 99 mg/dL   BUN 15  6 - 23 mg/dL   Creatinine, Ser 0.81  0.50 - 1.35 mg/dL   Calcium 8.1 (*) 8.4 - 10.5 mg/dL   GFR calc non Af Amer >90  >90 mL/min   GFR calc Af Amer >90  >90 mL/min   Comment: (NOTE)     The eGFR has been calculated using the CKD EPI equation.     This calculation has not been validated in all clinical situations.     eGFR's persistently <90 mL/min signify possible Chronic Kidney     Disease.  CBC     Status: Abnormal   Collection Time    02/04/14  4:10 AM      Result Value Ref Range   WBC 11.5 (*) 4.0 - 10.5 K/uL   RBC 3.35 (*) 4.22 - 5.81 MIL/uL   Hemoglobin 10.6 (*) 13.0 - 17.0 g/dL   HCT 30.5 (*) 39.0 - 52.0 %   MCV 91.0  78.0 - 100.0 fL   MCH 31.6  26.0 - 34.0 pg   MCHC 34.8  30.0 - 36.0 g/dL   RDW 13.3  11.5 - 15.5 %   Platelets 109 (*) 150 - 400 K/uL   Comment: SPECIMEN CHECKED FOR CLOTS     REPEATED TO VERIFY     PLATELET COUNT CONFIRMED BY SMEAR  GLUCOSE, CAPILLARY     Status: Abnormal   Collection Time    02/04/14  7:30 AM      Result Value Ref Range     Glucose-Capillary 124 (*) 70 - 99 mg/dL   Comment 1 Notify RN      Imaging: Imaging results have been reviewed  Assessment/Plan:   1. Active Problems: 2.   CAD (coronary artery disease) 3.   Time Spent Directly with Patient:  20 minutes  Length of Stay:  LOS: 3 days   POD # 2 CABG X6. Looks great. VSS. NSR (PM on backup at 60). Off of drips. Labs OK. Being tx to tele. Nl progression per TCTS.  Lorretta Harp 02/04/2014, 9:08 AM

## 2014-02-04 NOTE — Discharge Summary (Signed)
[address]       [place] [address]             [phone]       [name] West 1951-03-20 63 y.o. [id number]  02/01/2014   Melrose Nakayama, MD  abnormal stress test CAD  FINAL DIAGNOSIS Left main and 3 vessel coronary artery disease  HPI: The patient is a 63 year old male with no prior cardiac history. He does have a history of hyperlipidemia and hypertension and a strong family history of CAD. He is a Corporate treasurer and a very active. He jogs on a regular basis until a few years ago when he developed joint issues. He continues to walk for miles several times a week. He did notice that he was getting tired more easily and having frequent palpitations when he would check his pulse. He was not having chest pain. He was recently evaluated by cardiology and admitted this hospitalization for cardiac catheterization. Evaluation also noted sleep apnea and an elevated calcium score. A stress test on 01/05/2014 was discontinued due to chest pain, shortness of breath and fatigue after 9 minutes and 10 minutes.  Past Medical History   Diagnosis  Date   .  Hyperlipidemia    .  Hypertension    .  Palpitations     No past surgical history on file.  Family History   Problem  Relation  Age of Onset   .  Cancer  Mother    .  Heart disease  Father      MI at 25, CABG at 72, pacemaker at 82   .  Heart attack  Paternal Grandfather  30   .  Heart failure  Maternal Grandfather  20    Social History: reports that he quit smoking about 40 years ago. He has never used smokeless tobacco. He reports that he drinks about 1.0 ounces of alcohol per week. He reports that he does not use illicit drugs.  Allergies: No Known Allergies  Medications:  Prior to Admission:  Prescriptions prior to admission   Medication  Sig  Dispense  Refill   .  aspirin 81 MG tablet  Take 81 mg by mouth daily.     Marland Kitchen  atorvastatin (LIPITOR) 40 MG tablet  Take 40 mg by mouth daily.     .   Cholecalciferol (VITAMIN D-3) 1000 UNITS CAPS  Take 1 capsule by mouth daily.     Marland Kitchen  lisinopril-hydrochlorothiazide (PRINZIDE,ZESTORETIC) 20-12.5 MG per tablet  Take 1 tablet by mouth daily.     .  metoprolol succinate (TOPROL-XL) 25 MG 24 hr tablet  Take 1 tablet (25 mg total) by mouth daily.  30 tablet  6   .  Multiple Vitamin (MULTIVITAMIN) capsule  Take 1 capsule by mouth daily. With A, C, E     .  Omega-3 Fatty Acids (FISH OIL) 1200 MG CAPS  Take 1 capsule by mouth daily.     Marland Kitchen  zolpidem (AMBIEN) 10 MG tablet  Take 1 tablet (10 mg total) by mouth at bedtime as needed        Hospital Course:  The patient was admitted for cardiac catheterization and he was found to have severe left main and 3 vessel CAD with an 80% stenosis of the left main, total occlusion of his LAD, subtotal occlusion of the circumflex and 70-80% stenosis of the RCA. The LAD filled distally via collaterals. He was admitted and placed on a  heparin drip. Cardiothoracic surgical consultation was obtained with Modesto Charon M.D. he evaluated the patient and studies and agree with recommendations to proceed with coronary artery surgical revascularization. The patient remained medically stable and surgery was scheduled. On 02/02/2014 he was taken the operating room and underwent the following procedure: DATE OF PROCEDURE: 02/02/2014  DATE OF DISCHARGE:  OPERATIVE REPORT  PREOPERATIVE DIAGNOSIS: Left main and three-vessel coronary artery  disease.  POSTOPERATIVE DIAGNOSIS: Left main and three-vessel coronary artery  disease.  PROCEDURE: Median sternotomy, extracorporeal circulation, coronary  artery bypass grafting x6 (left internal mammary artery to LAD, free  right internal mammary artery to obtuse marginal 1, sequential saphenous  vein graft to the second and third diagonals, sequential saphenous vein  graft to distal right coronary and posterior descending), endoscopic  vein harvest right thigh.  SURGEON: Revonda Standard.  Roxan Hockey, M.D.  ASSISTANT: Lars Pinks, PA-C  ANESTHESIA: General.  FINDINGS: Transesophageal echocardiography revealed normal left  ventricular function with no valvular pathology. Good quality conduits  and good quality targets.  The patient was taken from  the operating room to the surgical intensive care unit in good  condition.  Postoperative hospital course:  Overall the patient has progressed nicely. He was weaned from the ventilator without difficulty and has remained neurologically intact. He has maintained stable hemodynamics although he is initially required temporary AAI pacing do to bradycardias into the forties. This has improved and he is currently maintaining a heart rate in the seventies and eighties. He does have a mild expected acute blood loss anemia. All routine lines, monitors and drainage devices have been discontinued in the standard fashion. He is tolerating gradually increasing activities using standard cardiac rehabilitation protocols. Incisions are noted to be healing well without evidence of infection. He has a moderate volume overload but is responding well to diuretics. His overall status is felt to be tentatively stable for discharge in the next 48 hours pending ongoing reevaluation of his recovery.     Recent Labs  02/05/14 0545  NA 137  K 4.2  CL 99  CO2 24  GLUCOSE 115*  BUN 14  CALCIUM 8.7    Recent Labs  02/05/14 0545  WBC 11.0*  HGB 11.6*  HCT 33.5*  PLT 161   No results found for this basename: INR,  in the last 72 hours   Discharge Instructions:  The patient is discharged to home with extensive instructions on wound care and progressive ambulation.  They are instructed not to drive or perform any heavy lifting until returning to see the physician in his office.  Discharge Diagnosis:  abnormal stress test CAD Expected acute blood loss anemia Secondary Diagnosis: Patient Active Problem List   Diagnosis Date Noted  . CAD  (coronary artery disease) 02/01/2014  . Abnormal nuclear stress test 01/26/2014  . Agatston coronary artery calcium score between 200 and 399 12/29/2013  . PVC's (premature ventricular contractions) 12/29/2013  . Hyperlipidemia 12/01/2013  . Essential hypertension 12/01/2013  . Family history of early CAD 12/01/2013  . Palpitations 12/01/2013   Past Medical History  Diagnosis Date  . Hyperlipidemia   . Hypertension   . Palpitations    Medications at discharge:   Medication List    STOP taking these medications       aspirin 81 MG tablet  Replaced by:  aspirin 325 MG EC tablet     lisinopril-hydrochlorothiazide 20-12.5 MG per tablet  Commonly known as:  PRINZIDE,ZESTORETIC      TAKE these medications  aspirin 325 MG EC tablet  Take 1 tablet (325 mg total) by mouth daily.     atorvastatin 40 MG tablet  Commonly known as:  LIPITOR  Take 40 mg by mouth daily.     Fish Oil 1200 MG Caps  Take 1 capsule by mouth daily.     furosemide 40 MG tablet  Commonly known as:  LASIX  Take 1 tablet (40 mg total) by mouth daily. For 5 days then stop.     metoprolol succinate 25 MG 24 hr tablet  Commonly known as:  TOPROL-XL  Take 1 tablet (25 mg total) by mouth daily.     multivitamin capsule  Take 1 capsule by mouth daily. With A, C, E     oxyCODONE 5 MG immediate release tablet  Commonly known as:  Oxy IR/ROXICODONE  Take 1-2 tablets (5-10 mg total) by mouth every 4 (four) hours as needed for moderate pain.     potassium chloride SA 20 MEQ tablet  Commonly known as:  K-DUR,KLOR-CON  Take 1 tablet (20 mEq total) by mouth daily. For 5 days then stop.     Vitamin D-3 1000 UNITS Caps  Take 1 capsule by mouth daily.     zolpidem 10 MG tablet  Commonly known as:  AMBIEN  Take 1 tablet (10 mg total) by mouth at bedtime as needed for sleep.       The patient has been discharged on:   1.Beta Blocker:  Yes [  y ]                              No   [   ]                               If No, reason:  2.Ace Inhibitor/ARB: Yes [   ]                                     No  [  x  ]                                     If No, reason:Labile blood pressure  3.Statin:   Yes [ y  ]                  No  [   ]                  If No, reason:  4.Ecasa:  Yes  [ y  ]                  No   [   ]                  If No, reason:  Disposition: For discharge home  Patient's condition is [place], PA-C 02/07/2014  7:55 AM

## 2014-02-04 NOTE — Progress Notes (Signed)
Patient refused CPAP.

## 2014-02-04 NOTE — Discharge Instructions (Signed)
Endoscopic Saphenous Vein Harvesting °Care After °Refer to this sheet in the next few weeks. These instructions provide you with information on caring for yourself after your procedure. Your caregiver may also give you more specific instructions. Your treatment has been planned according to current medical practices, but problems sometimes occur. Call your caregiver if you have any problems or questions after your procedure. °HOME CARE INSTRUCTIONS °Medicine °· Take whatever pain medicine your surgeon prescribes. Follow the directions carefully. Do not take over-the-counter pain medicine unless your surgeon says it is okay. Some pain medicine can cause bleeding problems for several weeks after surgery. °· Follow your surgeon's instructions about driving. You will probably not be permitted to drive after heart surgery. °· Take any medicines your surgeon prescribes. Any medicines you took before your heart surgery should be checked with your caregiver before you start taking them again. °Wound care °· Ask your surgeon how long you should keep wearing your elastic bandage or stocking. °· Check the area around your surgical cuts (incisions) whenever your bandages (dressings) are changed. Look for any redness or swelling. °· You will need to return to have the stitches (sutures) or staples taken out. Ask your surgeon when to do that. °· Ask your surgeon when you can shower or bathe. °Activity °· Try to keep your legs raised when you are sitting. °· Do any exercises your caregivers have given you. These may include deep breathing exercises, coughing, walking, or other exercises. °SEEK MEDICAL CARE IF: °· You have any questions about your medicines. °· You have more leg pain, especially if your pain medicine stops working. °· New or growing bruises develop on your leg. °· Your leg swells, feels tight, or becomes red. °· You have numbness in your leg. °SEEK IMMEDIATE MEDICAL CARE IF: °· Your pain gets much worse. °· Blood  or fluid leaks from any of the incisions. °· Your incisions become warm, swollen, or red. °· You have chest pain. °· You have trouble breathing. °· You have a fever. °· You have more pain near your leg incision. °MAKE SURE YOU: °· Understand these instructions. °· Will watch your condition. °· Will get help right away if you are not doing well or get worse. °Document Released: 08/21/2011 Document Revised: 03/02/2012 Document Reviewed: 08/21/2011 °ExitCare® Patient Information ©2014 [employer]. °Coronary Artery Bypass Grafting, Care After °Refer to this sheet in the next few weeks. These instructions provide you with information on caring for yourself after your procedure. Your health care provider may also give you more specific instructions. Your treatment has been planned according to current medical practices, but problems sometimes occur. Call your health care provider if you have any problems or questions after your procedure. °WHAT TO EXPECT AFTER THE PROCEDURE °Recovery from surgery will be different for everyone. Some people feel well after 3 or 4 weeks, while for others it takes longer. After your procedure, it is typical to have the following: °· Nausea and a lack of appetite.   °· Constipation. °· Weakness and fatigue.   °· Depression or irritability.   °· Pain or discomfort at your incision site. °HOME CARE INSTRUCTIONS °· Only take over-the-counter or prescription medicines as directed by your health care provider. Take all medicines exactly as directed. Do not stop taking medicines or start any new medicines without first checking with your health care provider.   °· Take your pulse as directed by your health care provider. °· Perform deep breathing as directed by your health care provider. If you were   given a device called an incentive spirometer, use it to practice deep breathing several times a day. Support your chest with a pillow or your arms when you take deep breaths or cough. °· Keep  incision areas clean, dry, and protected. Remove or change any bandages (dressings) only as directed by your health care provider. You may have skin adhesive strips over the incision areas. Do not take the strips off. They will fall off on their own. °· Check incision areas daily for any swelling, redness, or drainage. °· If incisions were made in your legs, do the following: °· Avoid crossing your legs.   °· Avoid sitting for long periods of time. Change positions every 30 minutes.   °· Elevate your legs when you are sitting.   °· Wear compression stockings as directed by your health care provider. These stockings help keep blood clots from forming in your legs. °· Take showers once your health care provider approves. Until then, only take sponge baths. Pat incisions dry. Do not rub incisions with a washcloth or towel. Do not take tub baths or go swimming until your health care provider approves. °· Eat foods that are high in fiber, such as raw fruits and vegetables, whole grains, beans, and nuts. Meats should be lean cut. Avoid canned, processed, and fried foods. °· Drink enough fluids to keep your urine clear or pale yellow. °· Weigh yourself every day. This helps identify if you are retaining fluid that may make your heart and lungs work harder.   °· Rest and limit activity as directed by your health care provider. You may be instructed to: °· Stop any activity at once if you have chest pain, shortness of breath, irregular heartbeats, or dizziness. Get help right away if you have any of these symptoms. °· Move around frequently for short periods or take short walks as directed by your health care provider. Increase your activities gradually. You may need physical therapy or cardiac rehabilitation to help strengthen your muscles and build your endurance. °· Avoid lifting, pushing, or pulling anything heavier than 10 lb (4.5 kg) for at least 6 weeks after surgery. °· Do not drive until your health care provider  approves.  °· Ask your health care provider when you may return to work and resume sexual activity. °· Follow up with your health care provider as directed.   °SEEK MEDICAL CARE IF: °· You have swelling, redness, increasing pain, or drainage at the site of an incision.   °· You develop a fever.   °· You have swelling in your ankles or legs.   °· You have pain in your legs.   °· You have weight gain of 2 or more pounds a day. °· You are nauseous or vomit. °· You have diarrhea.  °SEEK IMMEDIATE MEDICAL CARE IF: °· You have chest pain that goes to your jaw or arms. °· You have shortness of breath.   °· You have a fast or irregular heartbeat.   °· You notice a "clicking" in your breastbone (sternum) when you move.   °· You have numbness or weakness in your arms or legs. °· You feel dizzy or lightheaded.   °MAKE SURE YOU: °· Understand these instructions. °· Will watch your condition. °· Will get help right away if you are not doing well or get worse. °Document Released: 06/28/2005 Document Revised: 08/11/2013 Document Reviewed: 05/18/2013 °ExitCare® Patient Information ©2014 [employer]. ° °

## 2014-02-04 NOTE — Progress Notes (Addendum)
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 (N/A) INTRAOPERATIVE TRANSESOPHAGEAL ECHOCARDIOGRAM (N/A)  Total Length of Stay:  LOS: 3 days   Subjective: Feels well, minimal pain  Objective: Vital signs in last 24 hours: Temp:  [97.5 F (36.4 C)-100.4 F (38 C)] 98.1 F (36.7 C) (02/13 0748) Pulse Rate:  [78-86] 81 (02/13 0700) Cardiac Rhythm:  [-] Atrial paced (02/13 0745) Resp:  [13-28] 16 (02/13 0700) BP: (88-130)/(54-69) 130/64 mmHg (02/13 0700) SpO2:  [93 %-100 %] 94 % (02/13 0700) Weight:  [238 lb 1.6 oz (108 kg)] 238 lb 1.6 oz (108 kg) (02/13 0500)  Filed Weights   02/02/14 1500 02/03/14 0500 02/04/14 0500  Weight: 227 lb 1.2 oz (103 kg) 234 lb 12.6 oz (106.5 kg) 238 lb 1.6 oz (108 kg)    Weight change: 11 lb 0.4 oz (5 kg)   Hemodynamic parameters for last 24 hours: PAP: (33-48)/(16-25) 38/20 mmHg  Intake/Output from previous day: 02/12 0701 - 02/13 0700 In: 732.5 [P.O.:440; I.V.:192.5; IV Piggyback:100] Out: 1500 [Urine:1380; Chest Tube:120]  Intake/Output this shift: Total I/O In: -  Out: 100 [Urine:100]  Current Meds: Scheduled Meds: . acetaminophen  1,000 mg Oral 4 times per day   Or  . acetaminophen (TYLENOL) oral liquid 160 mg/5 mL  1,000 mg Per Tube 4 times per day  . aspirin EC  325 mg Oral Daily   Or  . aspirin  324 mg Per Tube Daily  . atorvastatin  40 mg Oral q1800  . bisacodyl  10 mg Oral Daily   Or  . bisacodyl  10 mg Rectal Daily  . Chlorhexidine Gluconate Cloth  6 each Topical Daily  . docusate sodium  200 mg Oral Daily  . enoxaparin (LOVENOX) injection  40 mg Subcutaneous QHS  . insulin aspart  0-24 Units Subcutaneous 6 times per day  . mupirocin ointment  1 application Nasal BID  . pantoprazole  40 mg Oral  Daily  . sodium chloride  3 mL Intravenous Q12H   Continuous Infusions: . sodium chloride 20 mL/hr (02/02/14 1538)  . sodium chloride 20 mL/hr (02/02/14 1515)  . sodium chloride    . dexmedetomidine Stopped (02/02/14 1655)  . lactated ringers 20 mL/hr (02/02/14 1542)  . nitroGLYCERIN Stopped (02/02/14 1515)  . phenylephrine (NEO-SYNEPHRINE) Adult infusion Stopped (02/03/14 1000)   PRN Meds:.metoprolol, midazolam, morphine injection, ondansetron (ZOFRAN) IV, oxyCODONE, sodium chloride  General appearance: alert, cooperative and no distress Heart: regular rate and rhythm Lungs: din in bases Abdomen: benign Extremities: mild edema Wound: healing well  Lab Results: CBC: Recent Labs  02/03/14 1700 02/03/14 1729 02/04/14 0410  WBC 12.3*  --  11.5*  HGB 11.1* 11.9* 10.6*  HCT 32.6* 35.0* 30.5*  PLT 122*  --  109*   BMET:  Recent Labs  02/03/14 0409  02/03/14 1729 02/04/14 0410  NA 139  --  137 137  K 4.2  --  4.2 4.2  CL 104  --  98 101  CO2 21  --   --  23  GLUCOSE 113*  --  129* 120*  BUN 11  --  13 15  CREATININE 0.78  < > 0.90 0.81  CALCIUM 7.7*  --   --  8.1*  < > = values in this interval not displayed.  PT/INR:  Recent Labs  02/02/14 1508  LABPROT 16.5*  INR 1.37   Radiology: Dg Chest Port 1 View  02/04/2014   CLINICAL DATA:  Status post cardiac Sir  EXAM: PORTABLE CHEST - 1 VIEW  COMPARISON:  02/03/2014  FINDINGS: Cardiac shadow is stable. The mediastinal drain and thoracostomy catheters have been removed in the interval. No pneumothorax is noted. Bibasilar atelectatic changes are seen. The Swan-Ganz catheter has been removed as well. A jugular sheath remains on the right.  IMPRESSION: Persistent bibasilar changes.  No pneumothorax is noted.   Electronically Signed   By: Inez Catalina M.D.   On: 02/04/2014 07:35   Dg Chest Portable 1 View In Am  02/03/2014   CLINICAL DATA:  Postop  EXAM: PORTABLE CHEST - 1 VIEW  COMPARISON:  02/02/2014  FINDINGS: Interval  withdrawal of the right IJ Swan-Ganz catheter, now terminating in the right main pulmonary artery.  Interval extubation and removal of enteric tube. Mild patchy bilateral lower lobe opacities, likely atelectasis.  Bilateral chest tubes and mediastinal drain.  No pneumothorax.  Cardiomegaly. Postsurgical changes related to prior CABG.  IMPRESSION: Interval withdrawal of the right IJ Swan-Ganz catheter, now terminating in the right main pulmonary artery.  Additional support apparatus as above.  No pneumothorax.   Electronically Signed   By: Julian Hy M.D.   On: 02/03/2014 08:02   Dg Chest Portable 1 View  02/02/2014   CLINICAL DATA:  Postop coronary artery bypass grafting  EXAM: PORTABLE CHEST - 1 VIEW  COMPARISON:  DG CHEST 2 VIEW dated 01/31/2014  FINDINGS: Low lung volumes. Chest tubes are appreciated within the lung bases. There is no evidence of a pneumothorax. Patient is status post recent median sternotomy and coronary artery bypass grafting. A Swan-Ganz catheter is appreciated with tip projecting in the distal region of the right pulmonary artery approximately 8-9 cm from the central hilar region. Retraction of approximately 8 cm is recommended. A mediastinal drain is identified. An endotracheal tube is appreciated with tip at the level of the clavicles. A NG tube is seen with tip projecting in the region of the stomach. With technique taking into consideration the lungs are clear. The osseous structures are unremarkable.  The Swan-Ganz catheter findings were discussed with the patient's floor nurse Ruel Favors who reported that the patient's attending physician retracted the Swan-Ganz catheter after this radiograph. A repeat revaluation chest radiograph is recommended.  IMPRESSION: 1. Swan-Ganz catheter tip projecting approximately 8-9 cm distal to the central right hilar region. Retraction of approximately of the catheter approximately 8 cm is recommended. These results were called by telephone at  the time of interpretation on 02/02/2014 at 4:09 PM to Ruel Favors the patient's floor nurse , who verbally acknowledged these results. She stated that the patient's attending physician was aware of the catheter positioning and retracted the catheter after this radiograph was obtained. 2. Remaining support lines and tubes as described above. There is otherwise no evidence of acute cardiopulmonary disease.   Electronically Signed   By: Margaree Mackintosh M.D.   On: 02/02/2014 16:12     Assessment/Plan: S/P Procedure(s) (LRB): CORONARY ARTERY BYPASS GRAFTING (CABG) times six using bilateral mammaries and right saphenous vein. (N/A) INTRAOPERATIVE TRANSESOPHAGEAL ECHOCARDIOGRAM (N/A)  1 doing well overall 2 gentle diuresis 3 HR improved- start low dose beta blocker,  AAI to 60 4 aggressive IS/Pulm toilet 5 routine rehab 6 sugars controlled 7 labs stable 8 tx to 2 [place] E 02/04/2014 8:03 AM  Patient seen and examined, agree with above CXR shows bibasilar atelectasis, but looks like an expiratory film Dc CBG/SSI

## 2014-02-04 NOTE — Progress Notes (Signed)
Received patient to room 2w18. Patient sitting on side of bed. Denies any distress at this time. VSS. Call bell near.[name]

## 2014-02-05 ENCOUNTER — Inpatient Hospital Stay (HOSPITAL_COMMUNITY): Payer: BC Managed Care – PPO

## 2014-02-05 LAB — BASIC METABOLIC PANEL
BUN: 14 mg/dL (ref 6–23)
CO2: 24 mEq/L (ref 19–32)
Calcium: 8.7 mg/dL (ref 8.4–10.5)
Chloride: 99 mEq/L (ref 96–112)
Creatinine, Ser: 0.75 mg/dL (ref 0.50–1.35)
GFR calc Af Amer: >90 mL/min (ref >90)
Glucose, Bld: 115 mg/dL — ABNORMAL HIGH (ref 70–99)
POTASSIUM: 4.2 meq/L (ref 3.7–5.3)
SODIUM: 137 meq/L (ref 137–147)

## 2014-02-05 LAB — CBC
HCT: 33.5 % — ABNORMAL LOW (ref 39.0–52.0)
Hemoglobin: 11.6 g/dL — ABNORMAL LOW (ref 13.0–17.0)
MCH: 31.3 pg (ref 26.0–34.0)
MCHC: 34.6 g/dL (ref 30.0–36.0)
MCV: 90.3 fL (ref 78.0–100.0)
Platelets: 161 10*3/uL (ref 150–400)
RBC: 3.71 MIL/uL — ABNORMAL LOW (ref 4.22–5.81)
RDW: 13.3 % (ref 11.5–15.5)
WBC: 11 10*3/uL — ABNORMAL HIGH (ref 4.0–10.5)

## 2014-02-05 LAB — GLUCOSE, CAPILLARY
Glucose-Capillary: 118 mg/dL — ABNORMAL HIGH (ref 70–99)
Glucose-Capillary: 103 mg/dL — ABNORMAL HIGH (ref 70–99)

## 2014-02-05 NOTE — Progress Notes (Addendum)
      [address]       [name] [address]             [phone]      3 Days Post-Op Procedure(s) (LRB): CORONARY ARTERY BYPASS GRAFTING (CABG) times six using bilateral mammaries and right saphenous vein. (N/A) INTRAOPERATIVE TRANSESOPHAGEAL ECHOCARDIOGRAM (N/A)  Subjective:  [name] has no complaints this morning.  He is ambulating with minimal assistance.  Objective: Vital signs in last 24 hours: Temp:  [97.5 F (36.4 C)-99.3 F (37.4 C)] 97.5 F (36.4 C) (02/14 0418) Pulse Rate:  [80-95] 84 (02/14 0418) Cardiac Rhythm:  [-] Normal sinus rhythm (02/13 2000) Resp:  [16-30] 18 (02/14 0418) BP: (97-115)/(57-70) 112/69 mmHg (02/14 0418) SpO2:  [92 %-100 %] 96 % (02/14 0418) Weight:  [239 lb 1.6 oz (108.455 kg)] 239 lb 1.6 oz (108.455 kg) (02/14 0418)  Intake/Output from previous day: 02/13 0701 - 02/14 0700 In: 360 [P.O.:360] Out: 400 [Urine:400]  General appearance: alert, cooperative and no distress Heart: regular rate and rhythm Lungs: clear to auscultation bilaterally Abdomen: soft, non-tender; bowel sounds normal; no masses,  no organomegaly Extremities: edema trace Wound: clean and dry  Lab Results:  Recent Labs  02/04/14 0410 02/05/14 0545  WBC 11.5* 11.0*  HGB 10.6* 11.6*  HCT 30.5* 33.5*  PLT 109* 161   BMET:  Recent Labs  02/04/14 0410 02/05/14 0545  NA 137 137  K 4.2 4.2  CL 101 99  CO2 23 24  GLUCOSE 120* 115*  BUN 15 14  CREATININE 0.81 0.75  CALCIUM 8.1* 8.7    PT/INR:  Recent Labs  02/02/14 1508  LABPROT 16.5*  INR 1.37   ABG    Component Value Date/Time   PHART 7.342* 02/02/2014 2114   HCO3 23.4 02/02/2014 2114   TCO2 25 02/03/2014 1729   ACIDBASEDEF 2.0 02/02/2014 2114   O2SAT 97.0 02/02/2014 2114   CBG (last 3)   Recent Labs  02/04/14 1143 02/04/14 1635 02/04/14 2215  GLUCAP 125* 94 92    Assessment/Plan: S/P Procedure(s) (LRB): CORONARY ARTERY BYPASS GRAFTING (CABG) times six using  bilateral mammaries and right saphenous vein. (N/A) INTRAOPERATIVE TRANSESOPHAGEAL ECHOCARDIOGRAM (N/A)  1. CV- NSR, rate in the 80s, will d/c back up pacer- continue Lopressor 2. Pulm- no acute issues, CXR with minimal atelectasis, no effusions, continue IS 3. Renal- creatinine WNL, mildly volume overloaded, on Lasix 4. Expected Acute Blood Loss Anemia- stable Hgb 11.6 5. Dispo- patient looks great, monitor HR today, likely d/c home in next 24-48 hours   LOS: 4 days    [place], [name] 02/05/2014  Continue Lasix Patient encouraged to keep right leg elevated Otherwise doing well Pacing wires out probably tomorrow Home in one to 2 days

## 2014-02-05 NOTE — Progress Notes (Signed)
CARDIAC REHAB PHASE I   Pt walking independently earlier today. HR 103 ST, SaO2 95 RA. Looks good. This was second walk today and he will walk more. Ed completed with pt and wife. Good reception. Interested in [facility] and will send referral to [facility]. [phone]  Darrick Meigs CES, ACSM 02/05/2014 3:08 PM

## 2014-02-06 NOTE — Progress Notes (Signed)
Resp care note;Pt refusing Cpap again tonight.

## 2014-02-06 NOTE — Progress Notes (Addendum)
      [address]       [place] [address]             [phone]      4 Days Post-Op Procedure(s) (LRB): CORONARY ARTERY BYPASS GRAFTING (CABG) times six using bilateral mammaries and right saphenous vein. (N/A) INTRAOPERATIVE TRANSESOPHAGEAL ECHOCARDIOGRAM (N/A)  Subjective:  [name] feels great.  He has no complaints.  He is up and moving around his room this morning.  He is ambulating without difficulty.   Objective: Vital signs in last 24 hours: Temp:  [98.1 F (36.7 C)-99.3 F (37.4 C)] 98.1 F (36.7 C) (02/15 0412) Pulse Rate:  [81-83] 83 (02/15 0412) Cardiac Rhythm:  [-] Normal sinus rhythm (02/14 2000) Resp:  [18-19] 18 (02/15 0412) BP: (108-121)/(63-79) 121/79 mmHg (02/15 0412) SpO2:  [91 %-96 %] 91 % (02/15 0412)  Intake/Output from previous day: 02/14 0701 - 02/15 0700 In: 480 [P.O.:480] Out: 1075 [Urine:1075]  General appearance: alert, cooperative and no distress Heart: regular rate and rhythm Lungs: clear to auscultation bilaterally Abdomen: soft, non-tender; bowel sounds normal; no masses,  no organomegaly Extremities: edema trace Wound: clean and dry  Lab Results:  Recent Labs  02/04/14 0410 02/05/14 0545  WBC 11.5* 11.0*  HGB 10.6* 11.6*  HCT 30.5* 33.5*  PLT 109* 161   BMET:  Recent Labs  02/04/14 0410 02/05/14 0545  NA 137 137  K 4.2 4.2  CL 101 99  CO2 23 24  GLUCOSE 120* 115*  BUN 15 14  CREATININE 0.81 0.75  CALCIUM 8.1* 8.7    PT/INR: No results found for this basename: LABPROT, INR,  in the last 72 hours ABG    Component Value Date/Time   PHART 7.342* 02/02/2014 2114   HCO3 23.4 02/02/2014 2114   TCO2 25 02/03/2014 1729   ACIDBASEDEF 2.0 02/02/2014 2114   O2SAT 97.0 02/02/2014 2114   CBG (last 3)   Recent Labs  02/04/14 2215 02/05/14 1120 02/05/14 1805  GLUCAP 92 118* 103*    Assessment/Plan: S/P Procedure(s) (LRB): CORONARY ARTERY BYPASS GRAFTING (CABG) times six using bilateral  mammaries and right saphenous vein. (N/A) INTRAOPERATIVE TRANSESOPHAGEAL ECHOCARDIOGRAM (N/A)  1. CV- NSR good rate and pressure control- continue Lopressor 2. Pulm- no acute issues, encouraged IS 3. Renal- weight is mildly elevated, about 8 lbs above admission- on Lasix 4. Dispo- patient doing very well, will d/c EPW, home in AM if no issues arise   LOS: 5 days    Ahmed Prima, ERIN 02/06/2014  patient examined and medical record reviewed,agree with above note. VAN TRIGT III,PETER 02/06/2014

## 2014-02-06 NOTE — Progress Notes (Signed)
Removed EPW per MD order per hospital policy. Pacing wires intact, VSS, patient tolerated well. Patient reminded to remain in bed for 1 hour. Will continue to monitor closely. [place] Nurse, RN

## 2014-02-06 NOTE — Progress Notes (Signed)
Resp Care Note; Pt refusing Cpap.

## 2014-02-07 MED ORDER — POTASSIUM CHLORIDE CRYS ER 20 MEQ PO TBCR: 20.0000 meq | EXTENDED_RELEASE_TABLET | Freq: Every day | ORAL | Status: DC

## 2014-02-07 MED ORDER — ATORVASTATIN CALCIUM 40 MG PO TABS: 40.0000 mg | ORAL_TABLET | Freq: Every day | ORAL | Status: DC

## 2014-02-07 MED ORDER — ASPIRIN 325 MG PO TBEC: 325.0000 mg | DELAYED_RELEASE_TABLET | Freq: Every day | ORAL | Status: DC

## 2014-02-07 MED ORDER — METOPROLOL SUCCINATE ER 25 MG PO TB24
25.0000 mg | ORAL_TABLET | Freq: Every day | ORAL | Status: DC
  Administered 2014-02-07: 25 mg via ORAL
  Filled 2014-02-07: qty 1

## 2014-02-07 MED ORDER — OXYCODONE HCL 5 MG PO TABS: 5.0000 mg | ORAL_TABLET | ORAL | Status: DC | PRN

## 2014-02-07 MED ORDER — FUROSEMIDE 40 MG PO TABS: 40.0000 mg | ORAL_TABLET | Freq: Every day | ORAL | Status: DC

## 2014-02-07 NOTE — Progress Notes (Signed)
      [address]       [place] [address]             [phone]        5 Days Post-Op Procedure(s) (LRB): CORONARY ARTERY BYPASS GRAFTING (CABG) times six using bilateral mammaries and right saphenous vein. (N/A) INTRAOPERATIVE TRANSESOPHAGEAL ECHOCARDIOGRAM (N/A)  Subjective: Patient has no complaints and wants to go home.  Objective: Vital signs in last 24 hours: Temp:  [97.6 F (36.4 C)-98.4 F (36.9 C)] 98.3 F (36.8 C) (02/16 0407) Pulse Rate:  [80-86] 86 (02/16 0407) Cardiac Rhythm:  [-] Normal sinus rhythm (02/15 2030) Resp:  [17-19] 17 (02/16 0407) BP: (106-165)/(56-89) 120/56 mmHg (02/16 0407) SpO2:  [95 %-97 %] 95 % (02/16 0407) Weight:  [106.913 kg (235 lb 11.2 oz)] 106.913 kg (235 lb 11.2 oz) (02/16 0407)  Pre op weight  103 kg Current Weight  02/07/14 106.913 kg (235 lb 11.2 oz)     Intake/Output from previous day: 02/15 0701 - 02/16 0700 In: 240 [P.O.:240] Out: 1100 [Urine:1100]   Physical Exam:  Cardiovascular: RRR, no murmurs, gallops, or rubs. Pulmonary: Clear to auscultation bilaterally; no rales, wheezes, or rhonchi. Abdomen: Soft, non tender, bowel sounds present. Extremities: Mild bilateral lower extremity edema. Wounds: Clean and dry.  No erythema or signs of infection.  Lab Results: CBC: Recent Labs  02/05/14 0545  WBC 11.0*  HGB 11.6*  HCT 33.5*  PLT 161   BMET:  Recent Labs  02/05/14 0545  NA 137  K 4.2  CL 99  CO2 24  GLUCOSE 115*  BUN 14  CREATININE 0.75  CALCIUM 8.7    PT/INR:  Lab Results  Component Value Date   INR 1.37 02/02/2014   INR 0.97 01/31/2014   ABG:  INR: Will add last result for INR, ABG once components are confirmed Will add last 4 CBG results once components are confirmed  Assessment/Plan:  1. CV - SR in the 80's. Will increase Toprol XL to 25 daily. 2.  Pulmonary - Encourage incentive spirometer 3. Volume Overload - Continue with daily lasix as is still above pre op  weight. 4.  Acute blood loss anemia - Last H and H 11.6 and 33.5 5.Remove sutures 6.Discharge patient  Jebediah Macrae MPA-C 02/07/2014,7:58 AM

## 2014-02-07 NOTE — Progress Notes (Signed)
DC CT sutures per MD orders and protocol; Discharge instructions reviewed and signed by patient; patient has no questions; prescriptions given to pt; DC IV and tele per order and protocol.  BARNETT, Marsh Dolly

## 2014-02-07 NOTE — Care Management Note (Signed)
    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  PTA, PT INDEPENDENT, LIVES WITH SPOUSE.     Action/Plan:   WIE TO PROVIDE CARE AT [facility].   Anticipated DC Date:  02/07/2014   Anticipated DC Plan:  [facility]  CM consult      Choice offered to / List presented to:             Status of service:  Completed, signed off Medicare Important Message given?   (If response is "NO", the following Medicare IM given date fields will be blank) Date Medicare IM given:   Date Additional Medicare IM given:    Discharge Disposition:  HOME/SELF CARE  Per UR Regulation:  Reviewed for med. necessity/level of care/duration of stay  If discussed at [facility] of Stay Meetings, dates discussed:    Comments:

## 2014-02-11 ENCOUNTER — Other Ambulatory Visit: Payer: Self-pay

## 2014-02-11 DIAGNOSIS — G8918 Other acute postprocedural pain: Secondary | ICD-10-CM

## 2014-02-11 MED ORDER — OXYCODONE HCL 5 MG PO TABS: 5.0000 mg | ORAL_TABLET | Freq: Four times a day (QID) | ORAL | Status: DC | PRN

## 2014-02-11 NOTE — Telephone Encounter (Signed)
RX for Oxycodone 5 mg printed out and Dr Roxy Manns signed. Patient will pick RX up at front desk today.

## 2014-02-16 ENCOUNTER — Telehealth: Payer: Self-pay | Admitting: Internal Medicine

## 2014-02-16 NOTE — Telephone Encounter (Signed)
Ok

## 2014-02-16 NOTE — Telephone Encounter (Signed)
Returned call to patient. Had CABG about 2 weeks ago, discharged home on 2/16(?).. Stated that last night after sleeping about 3 hours was awoken by a bad dream(?). Reports his HR was up to about 100+bpm and his BP was elevated.   BP prior to going to bed 147/91 When woke up in middle of night was 176/101, HR 91 Decreased to 150/92 after about 10 mins  Reports he had been trying to wean off oxy IR 5mg  and had only take 1 tab before bedtime, so he took a pain pill then his BP decreased to about 140/88 and HR 70. Patient reports he may be trying to wean off his pain medication too quickly.  Denies CP/SOB  Just wanted to make [place] aware of this incident.   Patient will be set up to see PA/NP next week, per hospital discharge note to follow up in 2 weeks

## 2014-02-16 NOTE — Telephone Encounter (Signed)
Per answering service-Had episode last night,very concerned.

## 2014-02-21 ENCOUNTER — Ambulatory Visit (INDEPENDENT_AMBULATORY_CARE_PROVIDER_SITE_OTHER): Payer: BC Managed Care – PPO | Admitting: Physician Assistant

## 2014-02-21 ENCOUNTER — Encounter: Payer: Self-pay | Admitting: Physician Assistant

## 2014-02-21 ENCOUNTER — Telehealth: Payer: Self-pay | Admitting: *Deleted

## 2014-02-21 VITALS — BP 116/80 | HR 74 | Ht 73.0 in | Wt 218.0 lb

## 2014-02-21 DIAGNOSIS — R002 Palpitations: Secondary | ICD-10-CM

## 2014-02-21 DIAGNOSIS — K5900 Constipation, unspecified: Secondary | ICD-10-CM

## 2014-02-21 DIAGNOSIS — Z951 Presence of aortocoronary bypass graft: Secondary | ICD-10-CM

## 2014-02-21 DIAGNOSIS — I4892 Unspecified atrial flutter: Secondary | ICD-10-CM

## 2014-02-21 DIAGNOSIS — I2510 Atherosclerotic heart disease of native coronary artery without angina pectoris: Secondary | ICD-10-CM

## 2014-02-21 DIAGNOSIS — E785 Hyperlipidemia, unspecified: Secondary | ICD-10-CM

## 2014-02-21 DIAGNOSIS — I10 Essential (primary) hypertension: Secondary | ICD-10-CM

## 2014-02-21 MED ORDER — TRAMADOL HCL 50 MG PO TABS: 50.0000 mg | ORAL_TABLET | Freq: Three times a day (TID) | ORAL | Status: AC | PRN

## 2014-02-21 MED ORDER — ATORVASTATIN CALCIUM 40 MG PO TABS: 40.0000 mg | ORAL_TABLET | Freq: Every day | ORAL | Status: DC

## 2014-02-21 NOTE — Patient Instructions (Signed)
1.  Ok to add miralax for constipation. 2.  Stop oxycodone and start tramadol for pain. 3.  okay to take melatonin for sleep 4.  Follow up with Dr. Debara Pickett in a month. 5.  Go to cardiac rehab as much as you can.

## 2014-02-21 NOTE — Assessment & Plan Note (Signed)
Maintain sinus rhythm with a rate of 74 beats per minute.

## 2014-02-21 NOTE — Assessment & Plan Note (Signed)
Treated with Lipitor

## 2014-02-21 NOTE — Assessment & Plan Note (Signed)
Recommended adding Miralax daily or flax and chia seed to his diet.

## 2014-02-21 NOTE — Assessment & Plan Note (Signed)
Blood pressure is well controlled on current therapy

## 2014-02-21 NOTE — Assessment & Plan Note (Signed)
Incision appears to be healing well.  As noted a lot of constipation from oxycodone change to tramadol and see if that helps.

## 2014-02-21 NOTE — Telephone Encounter (Signed)
Faxed to [facility] - phase 2, no graded exercise.

## 2014-02-21 NOTE — Progress Notes (Signed)
Date:  02/21/2014   ID:  Lenn Sink, DOB 07-10-1951, MRN [id number]  PCP:  Andria Frames, MD  Primary Cardiologist: Debara Pickett     History of Present Illness: [name] West is a 63 y.o. male The patient is a 63 year old male with no prior cardiac history. He does have a history of hyperlipidemia and hypertension and a strong family history of CAD. He is a Corporate treasurer and a very active. He jogs on a regular basis until a few years ago when he developed joint issues. He continues to walk for miles several times a week. He did notice that he was getting tired more easily and having frequent palpitations when he would check his pulse. He was not having chest pain. He was recently evaluated by cardiology and admitted this hospitalization for cardiac catheterization. Evaluation also noted sleep apnea and an elevated calcium score. A stress test on 01/05/2014 was discontinued due to chest pain, shortness of breath and fatigue after 9 minutes and 10 minutes.   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 The LAD filled distally via collaterals. He was admitted and placed on a heparin drip. Cardiothoracic surgical consultation was obtained with Modesto Charon M.D. he evaluated the patient and studies and agree with recommendations to proceed with coronary artery surgical revascularization. The patient remained medically stable and surgery was scheduled. On 02/02/2014 he was taken the operating room and underwent CABGx6 with left internal mammary artery to LAD, free  right internal mammary artery to obtuse marginal 1, sequential saphenous vein graft to the second and third diagonals, sequential saphenous vein graft to distal right coronary and posterior descending.  Transesophageal echo revealed  ejection fraction and normal wall motion.  Patient presents today for post hospital followup.  He reports waking up from sleep tonight with increased heart rate. After rechecking his heart rate and blood pressure periodically it slowly returned to normal. He does recall having a unpleasant dream at that time. He reported terrible constipation from the oxycodone and has used mineral oil enema recently to help.  We did discuss various natural methods for relieving constipation such as she flaxseed added to the diet. As well as MiraLax.  The patient currently denies nausea, vomiting, fever, chest pain, shortness of breath, orthopnea, dizziness, PND, cough, congestion, abdominal pain, hematochezia, melena, lower extremity edema, claudication.  Wt Readings from Last 3 Encounters:  02/21/14 218 lb (98.884 kg)  02/07/14 235 lb 11.2 oz (106.913 kg)  02/07/14 235 lb 11.2 oz (106.913 kg)     Past Medical History  Diagnosis Date  . Hyperlipidemia   . Hypertension   . Palpitations     Current Outpatient Prescriptions  Medication Sig Dispense Refill  . aspirin EC 325 MG EC tablet Take 1 tablet (325 mg total) by mouth daily.  30 tablet  0  . atorvastatin (LIPITOR) 40 MG tablet Take 1 tablet (40 mg total) by mouth daily.  30 tablet  5  . Cholecalciferol (VITAMIN D-3) 1000 UNITS CAPS Take 1 capsule by mouth daily.      . metoprolol succinate (TOPROL-XL) 25 MG 24 hr tablet Take 1 tablet (25 mg total) by mouth daily.  30 tablet  6  . Multiple Vitamin (MULTIVITAMIN) capsule Take 1 capsule by mouth daily. With A,  C, E      . Omega-3 Fatty Acids (FISH OIL) 1200 MG CAPS Take 1 capsule by mouth daily.      . traMADol (ULTRAM) 50 MG tablet Take 1 tablet (50 mg total) by mouth every 8 (eight) hours as needed for moderate pain.  30 tablet  0  . zolpidem (AMBIEN) 10 MG tablet Take 1 tablet (10 mg total) by mouth at bedtime as needed for sleep.  1 tablet  0   No current facility-administered medications for this  visit.    Allergies:   No Known Allergies  Social History:  The patient  reports that he quit smoking about 40 years ago. He has never used smokeless tobacco. He reports that he drinks about 1.0 ounces of alcohol per week. He reports that he does not use illicit drugs.   Family history:   Family History  Problem Relation Age of Onset  . Cancer Mother   . Heart disease Father     MI at 21, CABG at 30, pacemaker at 57  . Heart attack Paternal Grandfather 28  . Heart failure Maternal Grandfather 61    ROS:  Please see the history of present illness.  All other systems reviewed and negative.   PHYSICAL EXAM: VS:  BP 116/80  Pulse 74  Ht 6\' 1"  (1.854 m)  Wt 218 lb (98.884 kg)  BMI 28.77 kg/m2 Well nourished, well developed, in no acute distress HEENT: Pupils are equal round react to light accommodation extraocular movements are intact.  Neck: no JVDNo cervical lymphadenopathy. Cardiac: Regular rate and rhythm without murmurs rubs or gallops. Lungs:  clear to auscultation bilaterally, no wheezing, rhonchi or rales Abd: soft, nontender, positive bowel sounds all quadrants, no hepatosplenomegaly Ext: no lower extremity edema.  2+ radial and dorsalis pedis pulses. Skin: warm and dry Neuro:  Grossly normal  EKG:    Normal sinus rhythm rate 74 beats per minute  ASSESSMENT AND PLAN:  Problem List Items Addressed This Visit   Hyperlipidemia     Treated with Lipitor    Relevant Medications      atorvastatin (LIPITOR) tablet   Essential hypertension     Blood pressure is well controlled on current therapy    Palpitations     Maintain sinus rhythm with a rate of 74 beats per minute.    CAD (coronary artery disease)   S/P CABG x 6(LIMA to LAD, RIMA to OM1, SVG to diag 2/3, SVG to RCA, SVG to PDA.)     Incision appears to be healing well.  As noted a lot of constipation from oxycodone change to tramadol and see if that helps.    Constipation     Recommended adding Miralax daily  or flax and chia seed to his diet.     Other Visit Diagnoses   Atrial flutter    -  Primary    Relevant Orders       EKG 12-Lead       Rhythm ECG, report      Discussed adding melatonin for sleep.

## 2014-02-23 ENCOUNTER — Other Ambulatory Visit: Payer: Self-pay | Admitting: *Deleted

## 2014-02-23 DIAGNOSIS — I2510 Atherosclerotic heart disease of native coronary artery without angina pectoris: Secondary | ICD-10-CM

## 2014-03-01 ENCOUNTER — Encounter: Payer: Self-pay | Admitting: Thoracic Surgery (Cardiothoracic Vascular Surgery)

## 2014-03-01 ENCOUNTER — Ambulatory Visit
Admission: RE | Admit: 2014-03-01 | Discharge: 2014-03-01 | Disposition: A | Discharge status: Home / Self Care | Payer: BC Managed Care – PPO | Source: Ambulatory Visit | Attending: Thoracic Surgery (Cardiothoracic Vascular Surgery) | Admitting: Thoracic Surgery (Cardiothoracic Vascular Surgery)

## 2014-03-01 ENCOUNTER — Ambulatory Visit (INDEPENDENT_AMBULATORY_CARE_PROVIDER_SITE_OTHER): Payer: Self-pay | Admitting: Thoracic Surgery (Cardiothoracic Vascular Surgery)

## 2014-03-01 VITALS — BP 123/78 | HR 71 | Resp 16 | Ht 73.0 in | Wt 218.0 lb

## 2014-03-01 DIAGNOSIS — I2510 Atherosclerotic heart disease of native coronary artery without angina pectoris: Secondary | ICD-10-CM

## 2014-03-01 DIAGNOSIS — Z951 Presence of aortocoronary bypass graft: Secondary | ICD-10-CM

## 2014-03-01 NOTE — Progress Notes (Signed)
HPI:  Mr. Haberland returns for a scheduled postoperative followup visit.  He is a 63 year old gentleman who presented with left main and three-vessel coronary disease. He underwent coronary bypass grafting x6 on February 11. His postoperative course was uncomplicated. He has continued to do well since he was discharged home. He to the oxycodone fairly frequently the first week he was home, but was having a lot of problems with constipation. He was switched Ultram and that helped with constipation, but has only had a take that once the past 8 days.  He has not had any anginal pain or shortness of breath. Sleep in a chair. He's been having trouble tolerating his CPAP mask.  Past Medical History  Diagnosis Date  . Hyperlipidemia   . Hypertension   . Palpitations       Current Outpatient Prescriptions  Medication Sig Dispense Refill  . aspirin EC 325 MG EC tablet Take 1 tablet (325 mg total) by mouth daily.  30 tablet  0  . atorvastatin (LIPITOR) 40 MG tablet Take 1 tablet (40 mg total) by mouth daily.  30 tablet  5  . Cholecalciferol (VITAMIN D-3) 1000 UNITS CAPS Take 1 capsule by mouth daily.      . metoprolol succinate (TOPROL-XL) 25 MG 24 hr tablet Take 1 tablet (25 mg total) by mouth daily.  30 tablet  6  . Multiple Vitamin (MULTIVITAMIN) capsule Take 1 capsule by mouth daily. With A, C, E      . Omega-3 Fatty Acids (FISH OIL) 1200 MG CAPS Take 1 capsule by mouth daily.      . traMADol (ULTRAM) 50 MG tablet Take 1 tablet (50 mg total) by mouth every 8 (eight) hours as needed for moderate pain.  30 tablet  0   No current facility-administered medications for this visit.    Physical Exam BP 123/78  Pulse 71  Resp 16  Ht 6\' 1"  (1.854 m)  Wt 218 lb (98.884 kg)  BMI 28.77 kg/m2  SpO53 66% 63 year old man in no acute distress Well-developed well-nourished Alert and oriented x3 with no focal deficits Right atrial rate and rhythm normal S1 and S2 no rub or murmur Lungs clear with  equal breath sounds bilaterally Sternum stable, incision clean dry and intact Leg incisions healing well No peripheral edema  Diagnostic Tests: CHEST 2 VIEW  COMPARISON: Chest x-ray of 02/05/2014  FINDINGS:  Aeration has improved somewhat. However there are still small  pleural effusions present with mild bibasilar atelectasis. No  pneumothorax is seen. Cardiomegaly is stable. Median sternotomy  sutures appear intact.  IMPRESSION:  Slightly better aeration. Persistent small pleural effusions and  mild basilar atelectasis.  Electronically Signed  By: Ivar Drape M.D.  On: 03/01/2014 09:58   Impression: 63 year old gentleman who is now about a month post coronary bypass grafting x6. He is doing extremely well. He really is ahead of where I would expect at this point in time. His exercise tolerance is good. Having minimal pain. He feels well in general.  He is planning to start cardiac rehabilitation next week.  He is taking steps to try he uses CPAP mask I emphasized the importance of that for his sleep apnea.  He should not lift anything over 10 pounds for another 2 weeks and nothing over 20 pounds for 4 weeks. He may begin driving, appropriate precautions were discussed.  Plan:  He will continue to be followed by Dr. Debara Pickett I would be happy to see him back at any time if  I can be of any further assistance with his care

## 2014-03-03 ENCOUNTER — Encounter (HOSPITAL_COMMUNITY)
Admission: RE | Admit: 2014-03-03 | Discharge: 2014-03-03 | Disposition: A | Discharge status: Home / Self Care | Payer: BC Managed Care – PPO | Source: Ambulatory Visit | Attending: Internal Medicine | Admitting: Internal Medicine

## 2014-03-03 DIAGNOSIS — Z951 Presence of aortocoronary bypass graft: Secondary | ICD-10-CM | POA: Insufficient documentation

## 2014-03-03 DIAGNOSIS — I2510 Atherosclerotic heart disease of native coronary artery without angina pectoris: Secondary | ICD-10-CM | POA: Insufficient documentation

## 2014-03-03 DIAGNOSIS — Z5189 Encounter for other specified aftercare: Secondary | ICD-10-CM | POA: Insufficient documentation

## 2014-03-03 NOTE — Progress Notes (Signed)
Cardiac Rehab Medication Review by a Pharmacist  Does the patient  feel that his/her medications are working for him/her?  yes  Has the patient been experiencing any side effects to the medications prescribed?  No - he states his blood pressure is low, but he experiences no symptoms.   Does the patient measure his/her own blood pressure or blood glucose at home?  yes - he states his wife is a Equities trader so she checks his BP often  Does the patient have any problems obtaining medications due to transportation or finances?   no  Understanding of regimen: excellent Understanding of indications: good Potential of compliance: excellent    Pharmacist comments: [name] West describes a good understanding of his medication regimen. He says he uses a pillbox to help with adherence to the medications, and it helps him never miss a dose. His wife is a Equities trader and checks his BP often. He speaks of no issues with his medications, and says they are working well for him and has no side effects to complain of. He does say his BP runs low particularly in the morning, but he doesn't have any accompanying symptoms. We spoke of changing positions slowly and monitoring for any dizziness, headache, or other signs of low blood pressure.    [name] West C. Jowanna Loeffler, PharmD Clinical Pharmacist-Resident Pager: [phone] Pharmacy: [phone] 03/03/2014 8:29 AM

## 2014-03-07 ENCOUNTER — Encounter (HOSPITAL_COMMUNITY)
Admission: RE | Admit: 2014-03-07 | Discharge: 2014-03-07 | Disposition: A | Discharge status: Home / Self Care | Payer: BC Managed Care – PPO | Source: Ambulatory Visit | Attending: Internal Medicine | Admitting: Internal Medicine

## 2014-03-07 NOTE — Progress Notes (Signed)
Pt in for his first day of exercise.  Pt tolerated light exercise with no complaints. Monitor showed Sr with rare PVC's. PHQ2 score 0.  Pt feels positive about his recovery and is looking forward to participating in the program.

## 2014-03-09 ENCOUNTER — Encounter (HOSPITAL_COMMUNITY): Payer: BC Managed Care – PPO

## 2014-03-10 ENCOUNTER — Ambulatory Visit: Payer: BC Managed Care – PPO | Admitting: Internal Medicine

## 2014-03-11 ENCOUNTER — Encounter (HOSPITAL_COMMUNITY)
Admission: RE | Admit: 2014-03-11 | Discharge: 2014-03-11 | Disposition: A | Discharge status: Home / Self Care | Payer: BC Managed Care – PPO | Source: Ambulatory Visit | Attending: Internal Medicine | Admitting: Internal Medicine

## 2014-03-14 ENCOUNTER — Encounter (HOSPITAL_COMMUNITY)
Admission: RE | Admit: 2014-03-14 | Discharge: 2014-03-14 | Disposition: A | Discharge status: Home / Self Care | Payer: BC Managed Care – PPO | Source: Ambulatory Visit | Attending: Internal Medicine | Admitting: Internal Medicine

## 2014-03-14 ENCOUNTER — Ambulatory Visit: Payer: BC Managed Care – PPO | Admitting: Cardiology

## 2014-03-16 ENCOUNTER — Encounter (HOSPITAL_COMMUNITY)
Admission: RE | Admit: 2014-03-16 | Discharge: 2014-03-16 | Disposition: A | Discharge status: Home / Self Care | Payer: BC Managed Care – PPO | Source: Ambulatory Visit | Attending: Internal Medicine | Admitting: Internal Medicine

## 2014-03-18 ENCOUNTER — Encounter (HOSPITAL_COMMUNITY)
Admission: RE | Admit: 2014-03-18 | Discharge: 2014-03-18 | Disposition: A | Discharge status: Home / Self Care | Payer: BC Managed Care – PPO | Source: Ambulatory Visit | Attending: Internal Medicine | Admitting: Internal Medicine

## 2014-03-21 ENCOUNTER — Encounter (HOSPITAL_COMMUNITY)
Admission: RE | Admit: 2014-03-21 | Discharge: 2014-03-21 | Disposition: A | Discharge status: Home / Self Care | Payer: BC Managed Care – PPO | Source: Ambulatory Visit | Attending: Internal Medicine | Admitting: Internal Medicine

## 2014-03-23 ENCOUNTER — Encounter: Payer: Self-pay | Admitting: Internal Medicine

## 2014-03-23 ENCOUNTER — Ambulatory Visit (INDEPENDENT_AMBULATORY_CARE_PROVIDER_SITE_OTHER): Payer: BC Managed Care – PPO | Admitting: Internal Medicine

## 2014-03-23 VITALS — BP 124/70 | HR 71 | Ht 73.0 in | Wt 218.2 lb

## 2014-03-23 DIAGNOSIS — Z951 Presence of aortocoronary bypass graft: Secondary | ICD-10-CM

## 2014-03-23 DIAGNOSIS — I493 Ventricular premature depolarization: Secondary | ICD-10-CM

## 2014-03-23 DIAGNOSIS — I2510 Atherosclerotic heart disease of native coronary artery without angina pectoris: Secondary | ICD-10-CM

## 2014-03-23 DIAGNOSIS — I10 Essential (primary) hypertension: Secondary | ICD-10-CM

## 2014-03-23 DIAGNOSIS — E785 Hyperlipidemia, unspecified: Secondary | ICD-10-CM

## 2014-03-23 DIAGNOSIS — I4949 Other premature depolarization: Secondary | ICD-10-CM

## 2014-03-23 NOTE — Patient Instructions (Signed)
Your physician wants you to follow-up in: 6 months with Dr. Hilty. You will receive a reminder letter in the mail two months in advance. If you don't receive a letter, please call our office to schedule the follow-up appointment.    

## 2014-03-25 ENCOUNTER — Encounter (HOSPITAL_COMMUNITY)
Admission: RE | Admit: 2014-03-25 | Discharge: 2014-03-25 | Disposition: A | Discharge status: Home / Self Care | Payer: BC Managed Care – PPO | Source: Ambulatory Visit | Attending: Internal Medicine | Admitting: Internal Medicine

## 2014-03-25 DIAGNOSIS — I2510 Atherosclerotic heart disease of native coronary artery without angina pectoris: Secondary | ICD-10-CM | POA: Insufficient documentation

## 2014-03-25 DIAGNOSIS — Z5189 Encounter for other specified aftercare: Secondary | ICD-10-CM | POA: Insufficient documentation

## 2014-03-25 DIAGNOSIS — Z951 Presence of aortocoronary bypass graft: Secondary | ICD-10-CM | POA: Insufficient documentation

## 2014-03-28 ENCOUNTER — Encounter (HOSPITAL_COMMUNITY)
Admission: RE | Admit: 2014-03-28 | Discharge: 2014-03-28 | Disposition: A | Discharge status: Home / Self Care | Payer: BC Managed Care – PPO | Source: Ambulatory Visit | Attending: Internal Medicine | Admitting: Internal Medicine

## 2014-03-30 ENCOUNTER — Encounter: Payer: Self-pay | Admitting: Internal Medicine

## 2014-03-30 ENCOUNTER — Encounter (HOSPITAL_COMMUNITY)
Admission: RE | Admit: 2014-03-30 | Discharge: 2014-03-30 | Disposition: A | Discharge status: Home / Self Care | Payer: BC Managed Care – PPO | Source: Ambulatory Visit | Attending: Internal Medicine | Admitting: Internal Medicine

## 2014-03-30 NOTE — Progress Notes (Signed)
OFFICE NOTE  Chief Complaint:  Palpitations  Primary Care Physician: Andria Frames, MD  HPI:  [name] West is a very pleasant 63 year old male who's been followed by Dr. Ronnald Ramp. He recently has been having more frequent palpitations and is interested in a cardiac visit to evaluate these as well as his cardiac risk. He tells me that he was initially diagnosed with abnormally high cholesterol in the 1990s. He was then started on Lipitor and has stayed on that since then. A few years ago he is noted to have onset of hypertension and has had some weight gain. He used to be an avid runner, doing about 4 miles 3 times a week. He did then developed the problems and is not exercising as much. He has had some weight gain, particularly around the neck. His wife, who is a Equities trader, noted that he does snore and may actually sleep in different sides of the house. She reports that he has had some witnessed apneic events in the past, but he is never had a sleep study. With regards to his palpitations, he reports feelings of skipped beats which were interpreted as possible PVCs. While he may be having this, an EKG in the office today did catch a short run of PAT which was 3 beats. I suspect this could be the cause of his palpitations. He is not on a beta blocker. Finally his family history is significant for heart disease both in his father and grandfather. He is asymptomatic, denying any chest pain or shortness of breath with exertion.  [name] West returns today for followup on his cardiac calcium score. The findings indicated a coronary calcium score of 313, which is 93% compared to age and sex-matched controls. He is also here to followup on his sleep study which she underwent on 12/10/2013. This demonstrated severe obstructive sleep apnea. During the total sleep time of 2 hours and 15 minutes, there were 55.6 AHI.  CPAP titration was recommended.  Based upon his frequent PVCs which are  persistent, recommended a nuclear stress test as there is evidence of multivessel coronary calcium. He underwent a nuclear stress test 01/05/2014. This was an exercise nuclear stress test. He exercised for 9 minutes and achieved 10 metabolic equivalents. Exercise was discontinued due to chest pain, shortness of breath and fatigue. There was 2 mm of horizontal ST segment depression in frequent ventricular ectopy and transient bigeminy noted. The nuclear perfusion images were interpreted as high wrist demonstrating an apical scar with peri-infarct ischemia and basal lateral ischemia.  EF was 46%.  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 The LAD filled distally via collaterals. He was admitted and placed on a heparin drip. Cardiothoracic surgical consultation was obtained with Modesto Charon M.D. he evaluated the patient and studies and agree with recommendations to proceed with coronary artery surgical revascularization. The patient remained medically stable and surgery was scheduled. On 02/02/2014 he was taken the operating room and underwent CABGx6 with left internal mammary artery to LAD, free right internal mammary artery to obtuse marginal 1, sequential saphenous vein graft to the second and third diagonals, sequential saphenous vein graft to distal right coronary and posterior descending. Transesophageal echo revealed ejection fraction and normal wall motion.  Today he returns and  is without complaints. Feeling quite well and is recovering quickly.  PMHx:  Past Medical History  Diagnosis Date  . Hyperlipidemia   . Hypertension   . Palpitations     Past Surgical History  Procedure Laterality Date  . Coronary artery bypass graft N/A 02/02/2014    Procedure: CORONARY ARTERY BYPASS GRAFTING  (CABG) times six using bilateral mammaries and right saphenous vein.;  Surgeon: Melrose Nakayama, MD;  Location: [place];  Service: Open Heart Surgery;  Laterality: N/A;  . Intraoperative transesophageal echocardiogram N/A 02/02/2014    Procedure: INTRAOPERATIVE TRANSESOPHAGEAL ECHOCARDIOGRAM;  Surgeon: Melrose Nakayama, MD;  Location: [place];  Service: Open Heart Surgery;  Laterality: N/A;    FAMHx:  Family History  Problem Relation Age of Onset  . Cancer Mother   . Heart disease Father     MI at 8, CABG at 53, pacemaker at 62  . Heart attack Paternal Grandfather 78  . Heart failure Maternal Grandfather 37    SOCHx:   reports that he quit smoking about 40 years ago. He has never used smokeless tobacco. He reports that he drinks about 1 - 1.5 ounces of alcohol per week. He reports that he does not use illicit drugs.  ALLERGIES:  No Known Allergies  ROS: A comprehensive review of systems was negative.  HOME MEDS: Current Outpatient Prescriptions  Medication Sig Dispense Refill  . aspirin EC 325 MG EC tablet Take 1 tablet (325 mg total) by mouth daily.  30 tablet  0  . atorvastatin (LIPITOR) 40 MG tablet Take 1 tablet (40 mg total) by mouth daily.  30 tablet  5  . Cholecalciferol (VITAMIN D-3) 1000 UNITS CAPS Take 1,000 Units by mouth daily.       Marland Kitchen ibuprofen (ADVIL,MOTRIN) 200 MG tablet Take 200 mg by mouth every 6 (six) hours as needed (muscle pain).      . metoprolol succinate (TOPROL-XL) 25 MG 24 hr tablet Take 1 tablet (25 mg total) by mouth daily.  30 tablet  6  . Multiple Vitamin (MULTIVITAMIN) capsule Take 1 capsule by mouth daily. With A, C, E      . Omega-3 Fatty Acids (FISH OIL) 1200 MG CAPS Take 1,200 mg by mouth daily.       . traMADol (ULTRAM) 50 MG tablet Take 1 tablet (50 mg total) by mouth every 8 (eight) hours as needed for moderate pain.  30 tablet  0   No current facility-administered medications for this visit.    LABS/IMAGING: No results found for this  or any previous visit (from the past 48 hour(s)). No results found.  VITALS: BP 124/70  Pulse 71  Ht 6\' 1"  (1.854 m)  Wt 218 lb 3.2 oz (98.975 kg)  BMI 28.79 kg/m2  EXAM: deferred  EKG: Normal sinus rhythm at 71  ASSESSMENT: 1. Palpitations-resolved 2. Hyperlipidemia-at goal 3. Hypertension-controlled 4. Family history of premature coronary disease 5. Abnormal coronary calcium score of 313 6. CAD s/p CABG x 6 (see anatomy above)  PLAN: 1.   [name] West was fortunately found to have multivessel coronary disease. He underwent 6 vessel CABG and actually did extremely well being discharged in several days after surgery. He continues to recover and has seen Dr. Roxan Hockey who feels he is doing quite well. At this point I feel that he can be released to start cardiac rehabilitation. He is currently on a good medical regimen. Plan to see him back in 6 months or sooner as necessary.  [name] Noa  C. Debara Pickett, MD, [facility] Attending Cardiologist [place] 03/30/2014, 8:33 AM

## 2014-04-01 ENCOUNTER — Encounter (HOSPITAL_COMMUNITY)
Admission: RE | Admit: 2014-04-01 | Discharge: 2014-04-01 | Disposition: A | Discharge status: Home / Self Care | Payer: BC Managed Care – PPO | Source: Ambulatory Visit | Attending: Internal Medicine | Admitting: Internal Medicine

## 2014-04-01 NOTE — Progress Notes (Signed)
[name] West 63 y.o. male Nutrition Note Spoke with pt.  Nutrition Survey reviewed with pt. Pt reports he has made changes to his diet. According to pt's MEDFICTS score, pt is not following the Therapeutic Lifestyle Changes diet at this time. Ways to continue to improve pt diet discussed. Pt wants to lose wt. Pt states he intentionally lost 30 lb prior to his heart surgery. Pt wt today 98.3 kg (216.3 lb), which is up 1.1 kg over the past month. According to pt wt in EMR, pt wt is down 26 lb over the past 4 months. Rate of wt loss appears safe. Wt loss tips reviewed. Pt expressed understanding of the information reviewed. Pt aware of nutrition education classes offered and plans on attending nutrition classes. Vitals - 1 value per visit 03/23/2014 03/03/2014 03/01/2014 02/21/2014 02/07/2014  Weight (lb) 218.2 215.39 218 218 235.7   Vitals - 1 value per visit 02/01/2014 01/26/2014 01/05/2014 12/29/2013 12/01/2013  Weight (lb)  231.3 229 229.6 242.1   Nutrition Diagnosis   Food-and nutrition-related knowledge deficit related to lack of exposure to information as related to diagnosis of: ? CVD   Overweight related to excessive energy intake as evidenced by a BMI of 29.6  Nutrition Intervention   Benefits of adopting Therapeutic Lifestyle Changes discussed when Medficts reviewed.   Pt to attend the Portion Distortion class   Pt to attend the  ? Nutrition I class - met 03/29/14                    ? Nutrition II class   Continue client-centered nutrition education by RD, as part of interdisciplinary care.  Goal(s)   Pt to pick healthier lunch foods.                                                 Pt to cut down on beef or pork high in saturated fat.   Pt to decrease dietary sodium intake.    Pt to watch out for sweets and added sugar.   Pt to go easy on snack chips.   Pt to identify food quantities necessary to achieve: ? wt loss at graduation from cardiac rehab.   Monitor and Evaluate progress toward  nutrition goal with team. Nutrition Risk:  Low   Derek Mound, M.Ed, RD, LDN, CDE 04/01/2014 12:24 PM

## 2014-04-04 ENCOUNTER — Encounter (HOSPITAL_COMMUNITY)
Admission: RE | Admit: 2014-04-04 | Discharge: 2014-04-04 | Disposition: A | Discharge status: Home / Self Care | Payer: BC Managed Care – PPO | Source: Ambulatory Visit | Attending: Internal Medicine | Admitting: Internal Medicine

## 2014-04-06 ENCOUNTER — Encounter (HOSPITAL_COMMUNITY)
Admission: RE | Admit: 2014-04-06 | Discharge: 2014-04-06 | Disposition: A | Discharge status: Home / Self Care | Payer: BC Managed Care – PPO | Source: Ambulatory Visit | Attending: Internal Medicine | Admitting: Internal Medicine

## 2014-04-08 ENCOUNTER — Encounter (HOSPITAL_COMMUNITY)
Admission: RE | Admit: 2014-04-08 | Discharge: 2014-04-08 | Disposition: A | Discharge status: Home / Self Care | Payer: BC Managed Care – PPO | Source: Ambulatory Visit | Attending: Internal Medicine | Admitting: Internal Medicine

## 2014-04-11 ENCOUNTER — Encounter (HOSPITAL_COMMUNITY)
Admission: RE | Admit: 2014-04-11 | Discharge: 2014-04-11 | Disposition: A | Discharge status: Home / Self Care | Payer: BC Managed Care – PPO | Source: Ambulatory Visit | Attending: Internal Medicine | Admitting: Internal Medicine

## 2014-04-13 ENCOUNTER — Encounter (HOSPITAL_COMMUNITY)
Admission: RE | Admit: 2014-04-13 | Discharge: 2014-04-13 | Disposition: A | Discharge status: Home / Self Care | Payer: BC Managed Care – PPO | Source: Ambulatory Visit | Attending: Internal Medicine | Admitting: Internal Medicine

## 2014-04-15 ENCOUNTER — Encounter (HOSPITAL_COMMUNITY)
Admission: RE | Admit: 2014-04-15 | Discharge: 2014-04-15 | Disposition: A | Discharge status: Home / Self Care | Payer: BC Managed Care – PPO | Source: Ambulatory Visit | Attending: Internal Medicine | Admitting: Internal Medicine

## 2014-04-18 ENCOUNTER — Telehealth: Payer: Self-pay | Admitting: Internal Medicine

## 2014-04-18 ENCOUNTER — Encounter (HOSPITAL_COMMUNITY)
Admission: RE | Admit: 2014-04-18 | Discharge: 2014-04-18 | Disposition: A | Discharge status: Home / Self Care | Payer: BC Managed Care – PPO | Source: Ambulatory Visit | Attending: Internal Medicine | Admitting: Internal Medicine

## 2014-04-18 NOTE — Telephone Encounter (Signed)
Returned call and pt verified x 2.  Pt stated he is taking ASA 325 daily and a couple of weeks ago, he sees blood when he blows his nose in the morning.  Wants to know if he can stop it for a few days and then start w/ 81 mg.  Stated "there is right much blood" when he blows his nose in the mornings and his sinus cavity must be full of blood.  Pt informed the ASA shouldn't cause that much bleeding and informed his mucosa may be dry or there could be other issues w/ his sinuses.    Pt also wanted to inform he messed up on his medicine last week.  Stated he took lisinopril instead of Lipitor.  Stated he did check BP and it was fine.  Stated it was checked several times a day at Cardiac Rehab and there was no difference in BPs from last week compared to the last month.  Denied noticing any changes physically and stated he has since tossed it out and is back on Lipitor.    Advice  Apply thin layer of petroleum jelly to nasal mucosa at septum before bedtime  Dr. Debara Pickett will be notified of request: d/c ASA 325 x "few days" and start 81 mg ASA  May need to f/u w/ PCP for sinus issues if continues  Pt verbalized understanding and agreed w/ plan.  Message forwarded to Dr. Debara Pickett.

## 2014-04-18 NOTE — Telephone Encounter (Signed)
Please call,have questions about his Atorvastatin and aspirin.If you call after 1:30 call him [phone].

## 2014-04-20 ENCOUNTER — Encounter (HOSPITAL_COMMUNITY)
Admission: RE | Admit: 2014-04-20 | Discharge: 2014-04-20 | Disposition: A | Discharge status: Home / Self Care | Payer: BC Managed Care – PPO | Source: Ambulatory Visit | Attending: Internal Medicine | Admitting: Internal Medicine

## 2014-04-20 NOTE — Telephone Encounter (Signed)
He can decrease to 81 mg daily aspirin and stay on that. Should be fine.  Dr. Debara Pickett

## 2014-04-21 NOTE — Telephone Encounter (Signed)
Returned call.  Left message on "[facility]" VM that Dr. Debara Pickett stated he can decrease ASA to 81 mg daily and stay on that dose.  Also to call back before 4 pm if questions.

## 2014-04-21 NOTE — Telephone Encounter (Signed)
Returned call.  Left message to call back before 4pm.  

## 2014-04-22 ENCOUNTER — Encounter (HOSPITAL_COMMUNITY)
Admission: RE | Admit: 2014-04-22 | Discharge: 2014-04-22 | Disposition: A | Discharge status: Home / Self Care | Payer: BC Managed Care – PPO | Source: Ambulatory Visit | Attending: Internal Medicine | Admitting: Internal Medicine

## 2014-04-22 DIAGNOSIS — I2510 Atherosclerotic heart disease of native coronary artery without angina pectoris: Secondary | ICD-10-CM | POA: Insufficient documentation

## 2014-04-22 DIAGNOSIS — Z951 Presence of aortocoronary bypass graft: Secondary | ICD-10-CM | POA: Insufficient documentation

## 2014-04-22 DIAGNOSIS — Z5189 Encounter for other specified aftercare: Secondary | ICD-10-CM | POA: Insufficient documentation

## 2014-04-25 ENCOUNTER — Encounter (HOSPITAL_COMMUNITY): Payer: BC Managed Care – PPO

## 2014-04-25 ENCOUNTER — Telehealth (HOSPITAL_COMMUNITY): Payer: Self-pay | Admitting: Family Medicine

## 2014-04-27 ENCOUNTER — Encounter (HOSPITAL_COMMUNITY)
Admission: RE | Admit: 2014-04-27 | Discharge: 2014-04-27 | Disposition: A | Discharge status: Home / Self Care | Payer: BC Managed Care – PPO | Source: Ambulatory Visit | Attending: Internal Medicine | Admitting: Internal Medicine

## 2014-04-29 ENCOUNTER — Encounter (HOSPITAL_COMMUNITY)
Admission: RE | Admit: 2014-04-29 | Discharge: 2014-04-29 | Disposition: A | Discharge status: Home / Self Care | Payer: BC Managed Care – PPO | Source: Ambulatory Visit | Attending: Internal Medicine | Admitting: Internal Medicine

## 2014-05-02 ENCOUNTER — Encounter (HOSPITAL_COMMUNITY)
Admission: RE | Admit: 2014-05-02 | Discharge: 2014-05-02 | Disposition: A | Discharge status: Home / Self Care | Payer: BC Managed Care – PPO | Source: Ambulatory Visit | Attending: Internal Medicine | Admitting: Internal Medicine

## 2014-05-04 ENCOUNTER — Encounter (HOSPITAL_COMMUNITY)
Admission: RE | Admit: 2014-05-04 | Discharge: 2014-05-04 | Disposition: A | Discharge status: Home / Self Care | Payer: BC Managed Care – PPO | Source: Ambulatory Visit | Attending: Internal Medicine | Admitting: Internal Medicine

## 2014-05-06 ENCOUNTER — Encounter (HOSPITAL_COMMUNITY)
Admission: RE | Admit: 2014-05-06 | Discharge: 2014-05-06 | Disposition: A | Discharge status: Home / Self Care | Payer: BC Managed Care – PPO | Source: Ambulatory Visit | Attending: Internal Medicine | Admitting: Internal Medicine

## 2014-05-09 ENCOUNTER — Encounter (HOSPITAL_COMMUNITY)
Admission: RE | Admit: 2014-05-09 | Discharge: 2014-05-09 | Disposition: A | Discharge status: Home / Self Care | Payer: BC Managed Care – PPO | Source: Ambulatory Visit | Attending: Internal Medicine | Admitting: Internal Medicine

## 2014-05-11 ENCOUNTER — Encounter (HOSPITAL_COMMUNITY)
Admission: RE | Admit: 2014-05-11 | Discharge: 2014-05-11 | Disposition: A | Discharge status: Home / Self Care | Payer: BC Managed Care – PPO | Source: Ambulatory Visit | Attending: Internal Medicine | Admitting: Internal Medicine

## 2014-05-13 ENCOUNTER — Encounter (HOSPITAL_COMMUNITY)
Admission: RE | Admit: 2014-05-13 | Discharge: 2014-05-13 | Disposition: A | Discharge status: Home / Self Care | Payer: BC Managed Care – PPO | Source: Ambulatory Visit | Attending: Internal Medicine | Admitting: Internal Medicine

## 2014-05-18 ENCOUNTER — Encounter (HOSPITAL_COMMUNITY)
Admission: RE | Admit: 2014-05-18 | Discharge: 2014-05-18 | Disposition: A | Discharge status: Home / Self Care | Payer: BC Managed Care – PPO | Source: Ambulatory Visit | Attending: Internal Medicine | Admitting: Internal Medicine

## 2014-05-20 ENCOUNTER — Encounter: Payer: Self-pay | Admitting: Cardiovascular Disease

## 2014-05-20 ENCOUNTER — Encounter: Payer: Self-pay | Admitting: Internal Medicine

## 2014-05-20 ENCOUNTER — Encounter (HOSPITAL_COMMUNITY)
Admission: RE | Admit: 2014-05-20 | Discharge: 2014-05-20 | Disposition: A | Discharge status: Home / Self Care | Payer: BC Managed Care – PPO | Source: Ambulatory Visit | Attending: Internal Medicine | Admitting: Internal Medicine

## 2014-05-23 ENCOUNTER — Encounter (HOSPITAL_COMMUNITY)
Admission: RE | Admit: 2014-05-23 | Discharge: 2014-05-23 | Disposition: A | Discharge status: Home / Self Care | Payer: BC Managed Care – PPO | Source: Ambulatory Visit | Attending: Internal Medicine | Admitting: Internal Medicine

## 2014-05-23 DIAGNOSIS — I2510 Atherosclerotic heart disease of native coronary artery without angina pectoris: Secondary | ICD-10-CM | POA: Insufficient documentation

## 2014-05-23 DIAGNOSIS — Z5189 Encounter for other specified aftercare: Secondary | ICD-10-CM | POA: Insufficient documentation

## 2014-05-23 DIAGNOSIS — Z951 Presence of aortocoronary bypass graft: Secondary | ICD-10-CM | POA: Insufficient documentation

## 2014-05-25 ENCOUNTER — Encounter (HOSPITAL_COMMUNITY)
Admission: RE | Admit: 2014-05-25 | Discharge: 2014-05-25 | Disposition: A | Discharge status: Home / Self Care | Payer: BC Managed Care – PPO | Source: Ambulatory Visit | Attending: Internal Medicine | Admitting: Internal Medicine

## 2014-05-27 ENCOUNTER — Encounter (HOSPITAL_COMMUNITY)
Admission: RE | Admit: 2014-05-27 | Discharge: 2014-05-27 | Disposition: A | Discharge status: Home / Self Care | Payer: BC Managed Care – PPO | Source: Ambulatory Visit | Attending: Internal Medicine | Admitting: Internal Medicine

## 2014-05-30 ENCOUNTER — Encounter (HOSPITAL_COMMUNITY)
Admission: RE | Admit: 2014-05-30 | Discharge: 2014-05-30 | Disposition: A | Discharge status: Home / Self Care | Payer: BC Managed Care – PPO | Source: Ambulatory Visit | Attending: Internal Medicine | Admitting: Internal Medicine

## 2014-06-01 ENCOUNTER — Encounter (HOSPITAL_COMMUNITY)
Admission: RE | Admit: 2014-06-01 | Discharge: 2014-06-01 | Disposition: A | Discharge status: Home / Self Care | Payer: BC Managed Care – PPO | Source: Ambulatory Visit | Attending: Internal Medicine | Admitting: Internal Medicine

## 2014-06-03 ENCOUNTER — Encounter (HOSPITAL_COMMUNITY)
Admission: RE | Admit: 2014-06-03 | Discharge: 2014-06-03 | Disposition: A | Discharge status: Home / Self Care | Payer: BC Managed Care – PPO | Source: Ambulatory Visit | Attending: Internal Medicine | Admitting: Internal Medicine

## 2014-06-06 ENCOUNTER — Encounter (HOSPITAL_COMMUNITY)
Admission: RE | Admit: 2014-06-06 | Discharge: 2014-06-06 | Disposition: A | Discharge status: Home / Self Care | Payer: BC Managed Care – PPO | Source: Ambulatory Visit | Attending: Internal Medicine | Admitting: Internal Medicine

## 2014-06-06 NOTE — Progress Notes (Signed)
Pt graduates today with the completion of 36 exercise classes in the cardiac rehab phase II program.  Pt has made excellent progress and should be commended for his efforts both in exercise but also with educational classes.  Pt plans to continue his home exercise at local gym 4 days a week. Pt feels his participation in rehab has exceeded his expectations and he has met all goals he set at the onset of his rehab participation.  Medication list reconciled.  Repeat PHQ2 score 0. Cherre Huger, BSN

## 2014-06-08 ENCOUNTER — Encounter (HOSPITAL_COMMUNITY): Payer: BC Managed Care – PPO

## 2014-06-10 ENCOUNTER — Encounter (HOSPITAL_COMMUNITY): Payer: BC Managed Care – PPO

## 2014-07-15 ENCOUNTER — Other Ambulatory Visit: Payer: Self-pay | Admitting: Internal Medicine

## 2014-07-15 NOTE — Telephone Encounter (Signed)
Rx refill sent to patient pharmacy   

## 2014-07-29 ENCOUNTER — Telehealth: Payer: Self-pay | Admitting: Internal Medicine

## 2014-07-29 DIAGNOSIS — E785 Hyperlipidemia, unspecified: Secondary | ICD-10-CM

## 2014-07-29 DIAGNOSIS — Z79899 Other long term (current) drug therapy: Secondary | ICD-10-CM

## 2014-07-29 NOTE — Telephone Encounter (Signed)
Left message for patient that labs were ordered & he'll need to be fasting. Lab slips mailed to patient.

## 2014-07-29 NOTE — Telephone Encounter (Signed)
Is needing a lab order sent to him before his appt on 09/26/2014.Marland KitchenMarland Kitchen

## 2014-09-08 LAB — LIPID PANEL
Cholesterol: 164 mg/dL (ref 0–200)
HDL: 43 mg/dL (ref >39)
LDL CALC: 104 mg/dL — AB (ref 0–99)
TRIGLYCERIDES: 83 mg/dL (ref <150)
Total CHOL/HDL Ratio: 3.8 Ratio
VLDL: 17 mg/dL (ref 0–40)

## 2014-09-08 LAB — COMPREHENSIVE METABOLIC PANEL
ALT: 26 U/L (ref 0–53)
AST: 25 U/L (ref 0–37)
Albumin: 4.4 g/dL (ref 3.5–5.2)
Alkaline Phosphatase: 78 U/L (ref 39–117)
BILIRUBIN TOTAL: 0.9 mg/dL (ref 0.2–1.2)
BUN: 19 mg/dL (ref 6–23)
CO2: 27 mEq/L (ref 19–32)
CREATININE: 0.85 mg/dL (ref 0.50–1.35)
Calcium: 9.4 mg/dL (ref 8.4–10.5)
Chloride: 105 mEq/L (ref 96–112)
Glucose, Bld: 92 mg/dL (ref 70–99)
Potassium: 4.6 mEq/L (ref 3.5–5.3)
Sodium: 139 mEq/L (ref 135–145)
Total Protein: 6.5 g/dL (ref 6.0–8.3)

## 2014-09-09 ENCOUNTER — Telehealth: Payer: Self-pay | Admitting: *Deleted

## 2014-09-09 MED ORDER — EZETIMIBE 10 MG PO TABS
10.0000 mg | ORAL_TABLET | Freq: Every day | ORAL | Status: DC
Start: 2014-09-09 — End: 2015-03-10

## 2014-09-09 MED ORDER — ATORVASTATIN CALCIUM 40 MG PO TABS: 40.0000 mg | ORAL_TABLET | Freq: Every day | ORAL | Status: DC

## 2014-09-09 NOTE — Telephone Encounter (Signed)
Message copied by Fidel Levy on Fri Sep 09, 2014  9:15 AM ------      Message from: Pixie Casino      Created: Thu Sep 08, 2014  8:24 AM       LDL goal <70 -- would recommend adding Zetia 10 mg daily to Lipitor. Important to keep cholesterol low to help bypass grafts stay open longer.            Dr. Debara Pickett ------

## 2014-09-09 NOTE — Telephone Encounter (Signed)
Provided patient with lab results and instructed him on addition of zetia. Patient is agreeable. Med ordered and refill for lipitor sent to pharmacy as well.

## 2014-09-09 NOTE — Telephone Encounter (Signed)
LMTCB for lab results.  

## 2014-09-26 ENCOUNTER — Encounter: Payer: Self-pay | Admitting: Internal Medicine

## 2014-09-26 ENCOUNTER — Ambulatory Visit (INDEPENDENT_AMBULATORY_CARE_PROVIDER_SITE_OTHER): Payer: BC Managed Care – PPO | Admitting: Internal Medicine

## 2014-09-26 VITALS — BP 118/70 | HR 48 | Ht 73.0 in | Wt 223.0 lb

## 2014-09-26 DIAGNOSIS — I10 Essential (primary) hypertension: Secondary | ICD-10-CM

## 2014-09-26 DIAGNOSIS — Z951 Presence of aortocoronary bypass graft: Secondary | ICD-10-CM

## 2014-09-26 DIAGNOSIS — I25110 Atherosclerotic heart disease of native coronary artery with unstable angina pectoris: Secondary | ICD-10-CM

## 2014-09-26 DIAGNOSIS — I493 Ventricular premature depolarization: Secondary | ICD-10-CM

## 2014-09-26 DIAGNOSIS — E785 Hyperlipidemia, unspecified: Secondary | ICD-10-CM

## 2014-09-26 NOTE — Progress Notes (Signed)
OFFICE NOTE  Chief Complaint:  Palpitations  Primary Care Physician: Andria Frames, MD  HPI:  [name] West is a very pleasant 63 year old male who's been followed by Dr. Ronnald Ramp. He recently has been having more frequent palpitations and is interested in a cardiac visit to evaluate these as well as his cardiac risk. He tells me that he was initially diagnosed with abnormally high cholesterol in the 1990s. He was then started on Lipitor and has stayed on that since then. A few years ago he is noted to have onset of hypertension and has had some weight gain. He used to be an avid runner, doing about 4 miles 3 times a week. He did then developed the problems and is not exercising as much. He has had some weight gain, particularly around the neck. His wife, who is a Equities trader, noted that he does snore and may actually sleep in different sides of the house. She reports that he has had some witnessed apneic events in the past, but he is never had a sleep study. With regards to his palpitations, he reports feelings of skipped beats which were interpreted as possible PVCs. While he may be having this, an EKG in the office today did catch a short run of PAT which was 3 beats. I suspect this could be the cause of his palpitations. He is not on a beta blocker. Finally his family history is significant for heart disease both in his father and grandfather. He is asymptomatic, denying any chest pain or shortness of breath with exertion.  Mr. Natzke returns today for followup on his cardiac calcium score. The findings indicated a coronary calcium score of 313, which is 93% compared to age and sex-matched controls. He is also here to followup on his sleep study which she underwent on 12/10/2013. This demonstrated severe obstructive sleep apnea. During the total sleep time of 2 hours and 15 minutes, there were 55.6 AHI.  CPAP titration was recommended.  Based upon his frequent PVCs which are  persistent, recommended a nuclear stress test as there is evidence of multivessel coronary calcium. He underwent a nuclear stress test 01/05/2014. This was an exercise nuclear stress test. He exercised for 9 minutes and achieved 10 metabolic equivalents. Exercise was discontinued due to chest pain, shortness of breath and fatigue. There was 2 mm of horizontal ST segment depression in frequent ventricular ectopy and transient bigeminy noted. The nuclear perfusion images were interpreted as high wrist demonstrating an apical scar with peri-infarct ischemia and basal lateral ischemia.  EF was 46%.  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 The LAD filled distally via collaterals. He was admitted and placed on a heparin drip. Cardiothoracic surgical consultation was obtained with Modesto Charon M.D. he evaluated the patient and studies and agree with recommendations to proceed with coronary artery surgical revascularization. The patient remained medically stable and surgery was scheduled. On 02/02/2014 he was taken the operating room and underwent CABGx6 with left internal mammary artery to LAD, free right internal mammary artery to obtuse marginal 1, sequential saphenous vein graft to the second and third diagonals, sequential saphenous vein graft to distal right coronary and posterior descending. Transesophageal echo revealed ejection fraction and normal wall motion.  Today he returns and  is without complaints. Feeling quite well and is recovering quickly.  Mr. Milosevic returns today and is feeling very well. He completed cardiac rehabilitation successfully and has had no issues. He had one episode of palpitations was doing significant yard work over the summer for several hours which may been related to  dehydration. He has since joined the gym and has been very active exercising 5 days a week. He's had some weight loss and dietary changes. Overall is going in the right direction. I recently added Zetia to his medications and he will be due to have a recheck in 3 months of his cholesterol.  PMHx:  Past Medical History  Diagnosis Date  . Hyperlipidemia   . Hypertension   . Palpitations     Past Surgical History  Procedure Laterality Date  . Coronary artery bypass graft N/A 02/02/2014    Procedure: CORONARY ARTERY BYPASS GRAFTING (CABG) times six using bilateral mammaries and right saphenous vein.;  Surgeon: Melrose Nakayama, MD;  Location: [facility];  Service: Open Heart Surgery;  Laterality: N/A;  . Intraoperative transesophageal echocardiogram N/A 02/02/2014    Procedure: INTRAOPERATIVE TRANSESOPHAGEAL ECHOCARDIOGRAM;  Surgeon: Melrose Nakayama, MD;  Location: [place];  Service: Open Heart Surgery;  Laterality: N/A;    FAMHx:  Family History  Problem Relation Age of Onset  . Cancer Mother   . Heart disease Father     MI at 6, CABG at 56, pacemaker at 5  . Heart attack Paternal Grandfather 15  . Heart failure Maternal Grandfather 11    SOCHx:   reports that he quit smoking about 40 years ago. He has never used smokeless tobacco. He reports that he drinks about 1 - 1.5 ounces of alcohol per week. He reports that he does not use illicit drugs.  ALLERGIES:  No Known Allergies  ROS: A comprehensive review of systems was negative.  HOME MEDS: Current Outpatient Prescriptions  Medication Sig Dispense Refill  . aspirin EC 81 MG tablet Take 81 mg by mouth daily.      Marland Kitchen atorvastatin (LIPITOR) 40 MG tablet Take 1 tablet (40 mg total) by mouth daily.  30 tablet  5  . Cholecalciferol (VITAMIN D-3) 1000 UNITS CAPS Take 1,000 Units by mouth daily.       Marland Kitchen ezetimibe (ZETIA) 10 MG tablet Take 1 tablet (10 mg total) by mouth daily.  30 tablet  5  . ibuprofen (ADVIL,MOTRIN) 200 MG tablet  Take 200 mg by mouth every 6 (six) hours as needed (muscle pain).      . metoprolol succinate (TOPROL-XL) 25 MG 24 hr tablet TAKE 1 TABLET (25 MG TOTAL) BY MOUTH DAILY.  30 tablet  6  . Multiple Vitamin (MULTIVITAMIN) capsule Take 1 capsule by mouth daily. With A, C, E      . Omega-3 Fatty Acids (FISH OIL) 1200 MG CAPS Take 1,200 mg by mouth daily.       . traMADol (ULTRAM) 50 MG tablet Take 1 tablet (50 mg total) by mouth every 8 (eight) hours as needed for moderate pain.  30 tablet  0   No current facility-administered medications for this visit.    LABS/IMAGING: No results found for this or any previous visit (from the past 48 hour(s)). No results found.  VITALS: BP 118/70  Pulse 48  Ht 6\' 1"  (1.854 m)  Wt 223 lb (101.152 kg)  BMI 29.43 kg/m2  EXAM: Gen.: Awake, in no apparent distress HEENT: PERRLA, EOMI Lungs: Clear bilaterally Cardiovascular: Regular  rate and rhythm, normal S1-S2, no murmur Abdomen: Soft, nontender, positive bowel sounds Extremities: No edema Neurologic: Grossly nonfocal Psychiatric: Normal mood, affect  EKG: Sinus bradycardia at 48  ASSESSMENT: 1. Palpitations-resolved 2. Hyperlipidemia-at goal 3. Hypertension-controlled 4. Family history of premature coronary disease 5. Abnormal coronary calcium score of 313 6. CAD s/p CABG x 6 (see anatomy above)  PLAN: 1.   Mr. Ringel was fortunately found to have multivessel coronary disease. He underwent 6 vessel CABG and actually did extremely well being discharged in several days after surgery. He is graduated cardiac rehabilitation and is now going to the gym 5 days a week. He is taking good care of himself, eating healthier and generally doing very well. I recently added Zetia to his medications we will recheck his cholesterol profile in 3 months.   Plan to see him back in 6 months.  Pixie Casino, MD, [facility] Attending Cardiologist [facility]  HILTY,Kenneth C 09/26/2014, 9:44 AM

## 2014-09-26 NOTE — Patient Instructions (Signed)
Your physician recommends that you return for lab work in December - fasting - to re-check cholesterol   Your physician wants you to follow-up in: 6 months with Dr. Debara Pickett. You will receive a reminder letter in the mail two months in advance. If you don't receive a letter, please call our office to schedule the follow-up appointment.

## 2014-12-01 ENCOUNTER — Encounter (HOSPITAL_COMMUNITY): Payer: Self-pay | Admitting: Internal Medicine

## 2014-12-15 LAB — NMR LIPOPROFILE WITH LIPIDS
Cholesterol, Total: 153 mg/dL (ref 100–199)
HDL Particle Number: 35.6 umol/L (ref >=30.5)
HDL SIZE: 8.7 nm — AB (ref >=9.2)
HDL-C: 47 mg/dL (ref >39)
LARGE HDL: 4.6 umol/L — AB (ref >=4.8)
LARGE VLDL-P: 1.4 nmol/L (ref <=2.7)
LDL (calc): 96 mg/dL (ref 0–99)
LDL Particle Number: 1145 nmol/L — ABNORMAL HIGH (ref <1000)
LDL Size: 21 nm (ref >=20.8)
LP-IR Score: 41 (ref <=45)
Small LDL Particle Number: 309 nmol/L (ref <=527)
Triglycerides: 51 mg/dL (ref 0–149)
VLDL Size: 37.4 nm (ref <=46.6)

## 2014-12-20 ENCOUNTER — Telehealth: Payer: Self-pay | Admitting: Internal Medicine

## 2014-12-20 DIAGNOSIS — E785 Hyperlipidemia, unspecified: Secondary | ICD-10-CM

## 2014-12-20 MED ORDER — ATORVASTATIN CALCIUM 80 MG PO TABS: 80.0000 mg | ORAL_TABLET | Freq: Every day | ORAL | Status: DC

## 2014-12-20 NOTE — Telephone Encounter (Signed)
Retunring your call,comcerning his lab results.

## 2014-12-20 NOTE — Telephone Encounter (Signed)
LMTCB

## 2014-12-20 NOTE — Telephone Encounter (Signed)
Spoke with patient and provided NMR results and med changes per Dr. Debara Pickett. Patient is agreeable to changes and will recheck NMR in 3 months (ordered and lab slips mailed to patient). Rx(s) sent to pharmacy electronically.

## 2014-12-20 NOTE — Telephone Encounter (Signed)
LM 12/29

## 2015-01-23 ENCOUNTER — Telehealth: Payer: Self-pay | Admitting: Internal Medicine

## 2015-01-23 DIAGNOSIS — E785 Hyperlipidemia, unspecified: Secondary | ICD-10-CM

## 2015-01-23 NOTE — Telephone Encounter (Signed)
NMR ordered and lab slips mailed to patient.

## 2015-01-23 NOTE — Telephone Encounter (Signed)
Pt will need some lab order put in before his visit with in April.  Thanks

## 2015-01-31 ENCOUNTER — Other Ambulatory Visit: Payer: Self-pay | Admitting: Internal Medicine

## 2015-01-31 NOTE — Telephone Encounter (Signed)
Rx(s) sent to pharmacy electronically.  

## 2015-03-10 ENCOUNTER — Other Ambulatory Visit: Payer: Self-pay | Admitting: Internal Medicine

## 2015-03-13 NOTE — Telephone Encounter (Signed)
Rx(s) sent to pharmacy electronically.  

## 2015-03-17 LAB — NMR LIPOPROFILE WITH LIPIDS
Cholesterol, Total: 141 mg/dL (ref 100–199)
HDL Particle Number: 31 umol/L (ref >=30.5)
HDL SIZE: 9.1 nm — AB (ref >=9.2)
HDL-C: 51 mg/dL (ref >39)
LARGE HDL: 4.8 umol/L (ref >=4.8)
LDL (calc): 81 mg/dL (ref 0–99)
LDL PARTICLE NUMBER: 936 nmol/L (ref <1000)
LDL SIZE: 20.6 nm (ref >=20.8)
LP-IR Score: 35 (ref <=45)
Large VLDL-P: 1.4 nmol/L (ref <=2.7)
SMALL LDL PARTICLE NUMBER: 380 nmol/L (ref <=527)
Triglycerides: 47 mg/dL (ref 0–149)
VLDL Size: 39.8 nm (ref <=46.6)

## 2015-03-21 ENCOUNTER — Telehealth: Payer: Self-pay | Admitting: Internal Medicine

## 2015-03-21 NOTE — Telephone Encounter (Signed)
Returning call from [place] his lab results .It is all right to leave his results on his answering machine.

## 2015-03-21 NOTE — Telephone Encounter (Signed)
LM w/ results on machine.

## 2015-03-22 ENCOUNTER — Encounter: Payer: Self-pay | Admitting: *Deleted

## 2015-03-27 ENCOUNTER — Encounter: Payer: Self-pay | Admitting: Internal Medicine

## 2015-03-27 ENCOUNTER — Ambulatory Visit (INDEPENDENT_AMBULATORY_CARE_PROVIDER_SITE_OTHER): Payer: BLUE CROSS/BLUE SHIELD | Admitting: Internal Medicine

## 2015-03-27 VITALS — BP 120/64 | HR 46 | Ht 73.0 in | Wt 224.8 lb

## 2015-03-27 DIAGNOSIS — Z951 Presence of aortocoronary bypass graft: Secondary | ICD-10-CM | POA: Diagnosis not present

## 2015-03-27 DIAGNOSIS — I493 Ventricular premature depolarization: Secondary | ICD-10-CM

## 2015-03-27 DIAGNOSIS — E785 Hyperlipidemia, unspecified: Secondary | ICD-10-CM

## 2015-03-27 DIAGNOSIS — I10 Essential (primary) hypertension: Secondary | ICD-10-CM | POA: Diagnosis not present

## 2015-03-27 NOTE — Patient Instructions (Signed)
Your physician has recommended you make the following change in your medication: DECREASE metoprolol succinate to 12.5mg  (1/2 tablet) daily   Your physician wants you to follow-up in: 1 year with Dr. Debara Pickett. You will receive a reminder letter in the mail two months in advance. If you don't receive a letter, please call our office to schedule the follow-up appointment.

## 2015-03-27 NOTE — Progress Notes (Signed)
OFFICE NOTE  Chief Complaint:  No complaints  Primary Care Physician: Andria Frames, MD  HPI:  [name] West is a very pleasant 64 year old male who's been followed by Dr. Ronnald Ramp. He recently has been having more frequent palpitations and is interested in a cardiac visit to evaluate these as well as his cardiac risk. He tells me that he was initially diagnosed with abnormally high cholesterol in the 1990s. He was then started on Lipitor and has stayed on that since then. A few years ago he is noted to have onset of hypertension and has had some weight gain. He used to be an avid runner, doing about 4 miles 3 times a week. He did then developed the problems and is not exercising as much. He has had some weight gain, particularly around the neck. His wife, who is a Equities trader, noted that he does snore and may actually sleep in different sides of the house. She reports that he has had some witnessed apneic events in the past, but he is never had a sleep study. With regards to his palpitations, he reports feelings of skipped beats which were interpreted as possible PVCs. While he may be having this, an EKG in the office today did catch a short run of PAT which was 3 beats. I suspect this could be the cause of his palpitations. He is not on a beta blocker. Finally his family history is significant for heart disease both in his father and grandfather. He is asymptomatic, denying any chest pain or shortness of breath with exertion.  Mr. [name] returns today for followup on his cardiac calcium score. The findings indicated a coronary calcium score of 313, which is 93% compared to age and sex-matched controls. He is also here to followup on his sleep study which she underwent on 12/10/2013. This demonstrated severe obstructive sleep apnea. During the total sleep time of 2 hours and 15 minutes, there were 55.6 AHI.  CPAP titration was recommended.  Based upon his frequent PVCs which are  persistent, recommended a nuclear stress test as there is evidence of multivessel coronary calcium. He underwent a nuclear stress test 01/05/2014. This was an exercise nuclear stress test. He exercised for 9 minutes and achieved 10 metabolic equivalents. Exercise was discontinued due to chest pain, shortness of breath and fatigue. There was 2 mm of horizontal ST segment depression in frequent ventricular ectopy and transient bigeminy noted. The nuclear perfusion images were interpreted as high wrist demonstrating an apical scar with peri-infarct ischemia and basal lateral ischemia.  EF was 46%.  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 The LAD filled distally via collaterals. He was admitted and placed on a heparin drip. Cardiothoracic surgical consultation was obtained with Modesto Charon M.D. he evaluated the patient and studies and agree with recommendations to proceed with coronary artery surgical revascularization. The patient remained medically stable and surgery was scheduled. On 02/02/2014 he was taken the operating room and underwent CABGx6 with left internal mammary artery to LAD, free right internal mammary artery to obtuse marginal 1, sequential saphenous vein graft to the second and third diagonals, sequential saphenous vein graft to distal right coronary and posterior descending. Transesophageal echo revealed ejection fraction and normal wall motion.  Today he returns  and is without complaints. Feeling quite well and is recovering quickly.  [name] returns today and is feeling very well. He completed cardiac rehabilitation successfully and has had no issues. He had one episode of palpitations was doing significant yard work over the summer for several hours which may been related to  dehydration. He has since joined the gym and has been very active exercising 5 days a week. He's had some weight loss and dietary changes. Overall is going in the right direction. I recently added Zetia to his medications and he will be due to have a recheck in 3 months of his cholesterol.  I saw [name] back in the office today. He had no significant complaints. His EKG was noted to be in sinus bradycardia with a heart rate around 45. At his last office visit the heart rate was in the 40s. He says when he goes to gym generally his heart rates around the 60s and is able to get up in the low 100s. Although he is asymptomatic and his blood pressure is normal, I'm concerned about the low rate and that he may be on too much beta blocker. We recently repeated his lipid profile which shows a marked improvement. LDL particle number is now 936, LDL-C is 81, HDL 51, and triglycerides 47. Total cholesterol is 141. This does show optimal control. I also think besides adjustments at his medicine that exercise and weight loss are helping him tremendously.  PMHx:  Past Medical History  Diagnosis Date  . Hyperlipidemia   . Hypertension   . Palpitations     Past Surgical History  Procedure Laterality Date  . Coronary artery bypass graft N/A 02/02/2014    Procedure: CORONARY ARTERY BYPASS GRAFTING (CABG) times six using bilateral mammaries and right saphenous vein.;  Surgeon: Melrose Nakayama, MD;  Location: [facility];  Service: Open Heart Surgery;  Laterality: N/A;  . Intraoperative transesophageal echocardiogram N/A 02/02/2014    Procedure: INTRAOPERATIVE TRANSESOPHAGEAL ECHOCARDIOGRAM;  Surgeon: Melrose Nakayama, MD;  Location: [place];  Service: Open Heart Surgery;  Laterality: N/A;  . Left heart catheterization with coronary angiogram N/A 02/01/2014    Procedure: LEFT HEART CATHETERIZATION WITH CORONARY ANGIOGRAM;  Surgeon: Pixie Casino, MD;  Location: [facility];  Service: Cardiovascular;   Laterality: N/A;    FAMHx:  Family History  Problem Relation Age of Onset  . Cancer Mother   . Heart disease Father     MI at 27, CABG at 98, pacemaker at 94  . Heart attack Paternal Grandfather 106  . Heart failure Maternal Grandfather 78    SOCHx:   reports that he quit smoking about 41 years ago. He has never used smokeless tobacco. He reports that he drinks about 1.0 - 1.5 oz of alcohol per week. He reports that he does not use illicit drugs.  ALLERGIES:  No Known Allergies  ROS: A comprehensive review of systems was negative.  HOME MEDS: Current Outpatient Prescriptions  Medication Sig Dispense Refill  . aspirin EC 81 MG tablet Take 81 mg by mouth daily.    Marland Kitchen atorvastatin (LIPITOR) 80 MG tablet Take 1 tablet (80 mg total) by mouth daily. 30 tablet 6  . Cholecalciferol (VITAMIN D-3) 1000 UNITS CAPS Take 1,000 Units by mouth daily.     Marland Kitchen ibuprofen (ADVIL,MOTRIN) 200 MG tablet Take 200 mg by mouth every 6 (six) hours as needed (muscle pain).    . metoprolol succinate (TOPROL-XL) 25 MG 24 hr tablet Take  12.5 mg by mouth daily.    . Multiple Vitamin (MULTIVITAMIN) capsule Take 1 capsule by mouth daily. With A, C, E    . Omega-3 Fatty Acids (FISH OIL) 1200 MG CAPS Take 1,200 mg by mouth daily.     . traMADol (ULTRAM) 50 MG tablet Take 1 tablet (50 mg total) by mouth every 8 (eight) hours as needed for moderate pain. 30 tablet 0  . ZETIA 10 MG tablet TAKE 1 TABLET (10 MG TOTAL) BY MOUTH DAILY. 30 tablet 5   No current facility-administered medications for this visit.    LABS/IMAGING: No results found for this or any previous visit (from the past 48 hour(s)). No results found.  VITALS: BP 120/64 mmHg  Pulse 46  Ht 6\' 1"  (1.854 m)  Wt 224 lb 12.8 oz (101.969 kg)  BMI 29.67 kg/m2  EXAM: Gen.: Awake, in no apparent distress HEENT: PERRLA, EOMI Lungs: Clear bilaterally Cardiovascular: Regular rate and rhythm, normal S1-S2, no murmur Abdomen: Soft, nontender, positive  bowel sounds Extremities: No edema Neurologic: Grossly nonfocal Psychiatric: Normal mood, affect  EKG: Sinus bradycardia at 45  ASSESSMENT: 1. Palpitations-resolved 2. Hyperlipidemia-at goal 3. Hypertension-controlled 4. Family history of premature coronary disease 5. Abnormal coronary calcium score of 313 6. CAD s/p CABG x 6 (see anatomy above)  PLAN: 1.   Mr. Madariaga is doing extremely well. His palpitations for the most part have resolved. His cholesterol is at goal. Blood pressure is well-controlled. He is very active and continues to lose weight and exercises several times a week. Heart rate however is lower than I would like to see. Based on that, would recommend decreasing his Toprol-XL down to 12.5 mg daily. Otherwise is doing well on his current medications.   Plan to see him back annually or sooner as necessary.Pixie Casino, MD, [facility] Attending Cardiologist [facility]  Desta Bujak C 03/27/2015, 6:36 PM

## 2015-07-05 IMAGING — CT CT HEART SCORING
1 of 3 series · 10 of 20 positions shown, 13 images · non-contrast
Comparison: None.

CLINICAL DATA: Risk stratification

EXAM:
Coronary Calcium Score
TECHNIQUE: The patient was scanned on a Siemens Sensation [REDACTED]ice scanner.
Axial non-contrast 3mm slices were carried out through the heart.
The data set was analyzed on a dedicated work station and scored
using the Agatson method.

[Series 6: st thins for reformat · axial · 0.44mm/px · z∈[+1079,+1183]mm · 10 of 128 slices shown, 13 images]
[im 12/128  vessel]
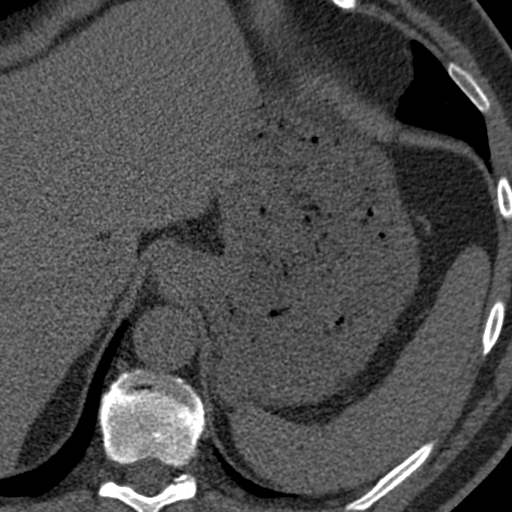
[im 12/128  lung]
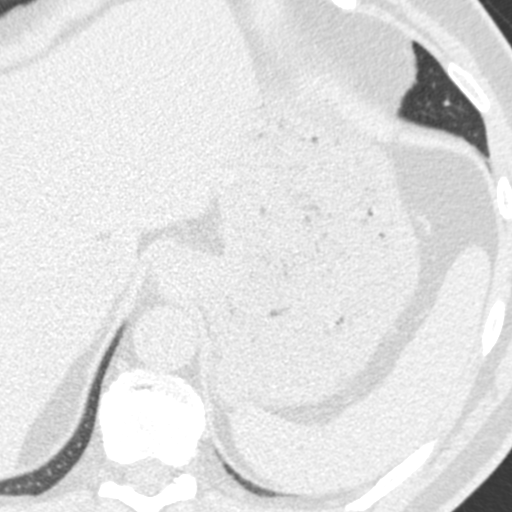
[im 24/128  vessel]
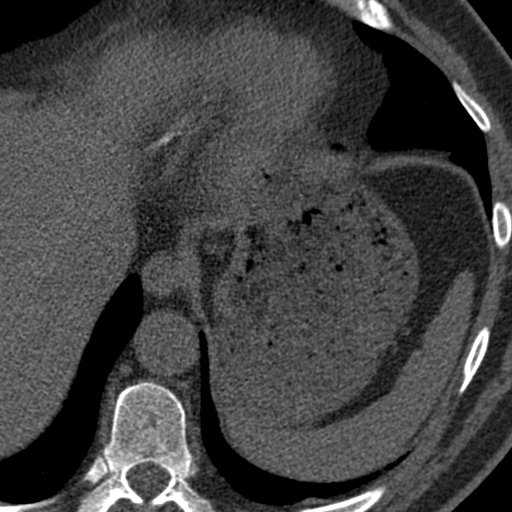
[im 35/128  vessel]
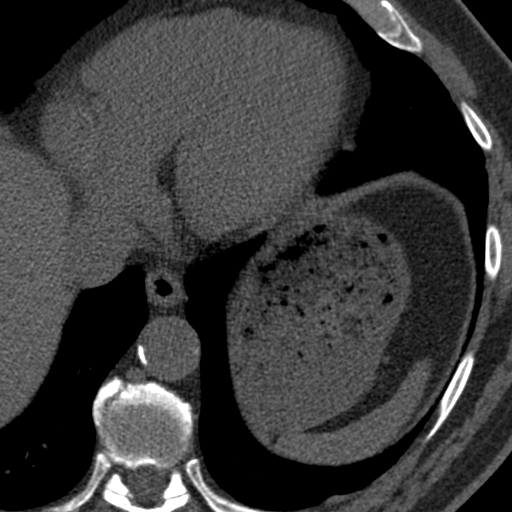
[im 47/128  vessel]
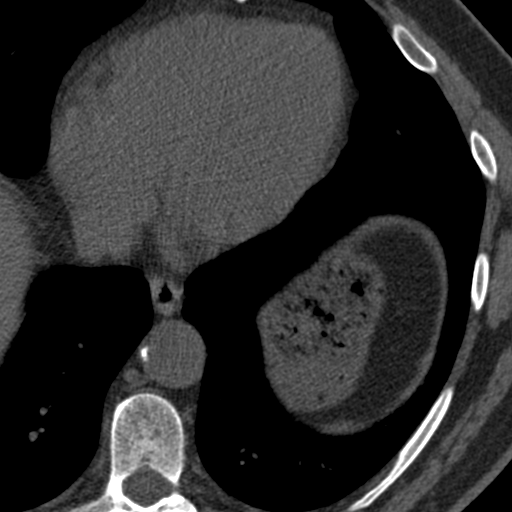
[im 58/128  vessel]
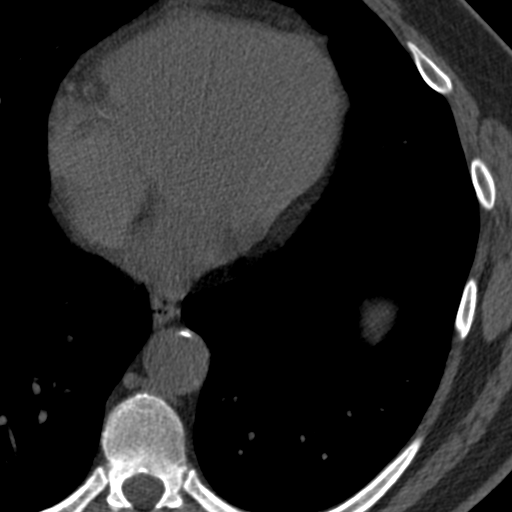
[im 58/128  lung]
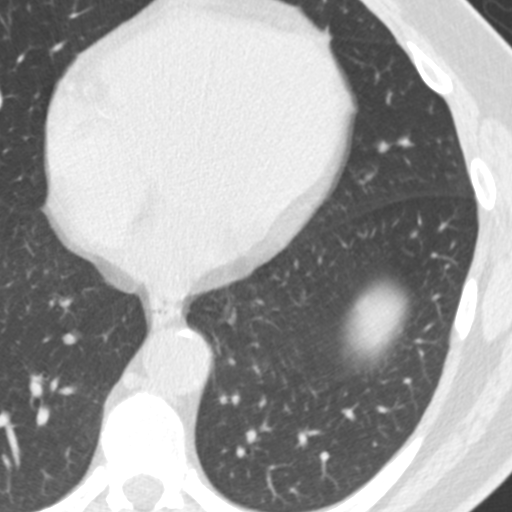
[im 70/128  vessel]
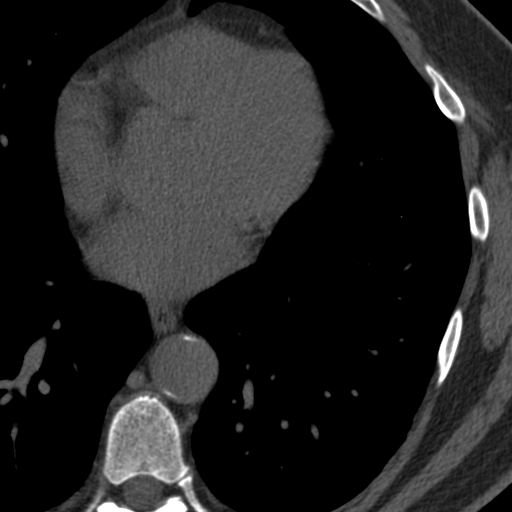
[im 81/128  vessel]
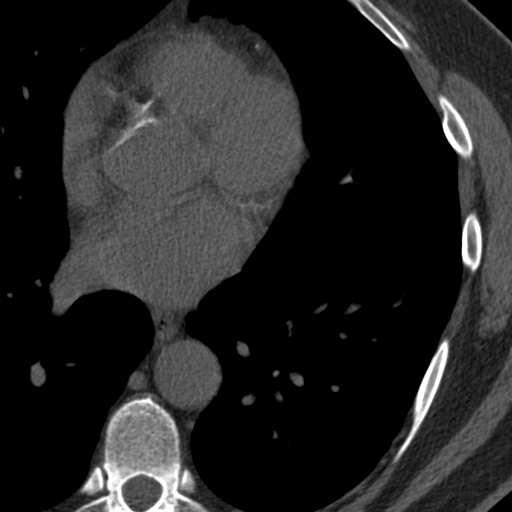
[im 93/128  vessel]
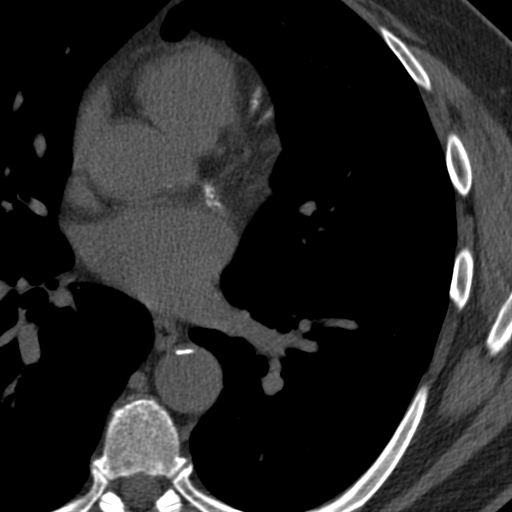
[im 104/128  vessel]
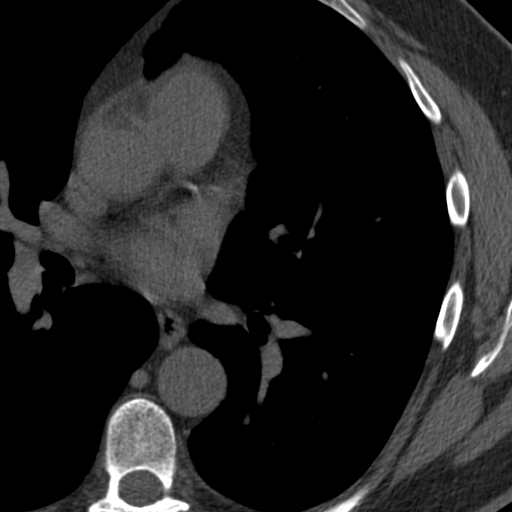
[im 104/128  lung]
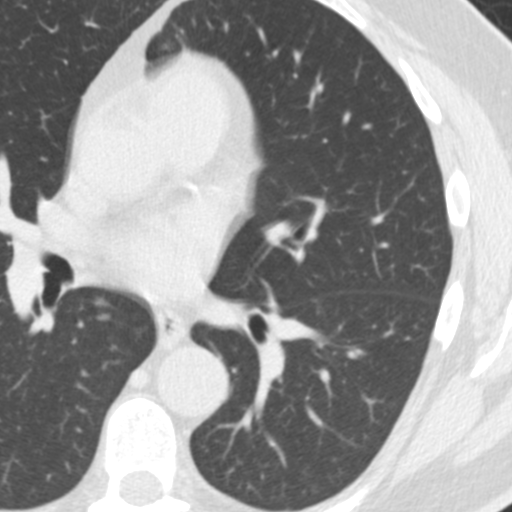
[im 116/128  vessel]
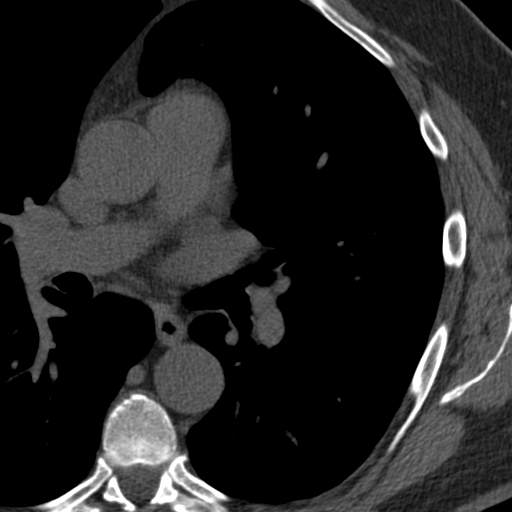

[10 of 20 positions shown; findings below may reference images not displayed]

FINDINGS: Non-cardiac: No significant non cardiac findings on limited lung and
soft tissue windows. See separate report from [REDACTED].

Coronary arteries:  Heavily calcified.
IMPRESSION: Coronary calcium score of 313. This was 93 percentile for age and
sex matched control.

Dem [facility] [place]

EXAM:
OVER-READ INTERPRETATION  CT CHEST

The following report is an over-read performed by radiologist Dr.
Otakara Srnec [REDACTED] on 12/07/2013. This
over-read does not include interpretation of cardiac or coronary
anatomy or pathology. The coronary calcium score interpretation by
the cardiologist is attached.
FINDINGS: Visualized lungs are essentially clear. 3 mm calcified granuloma in
the medial left upper lobe (series 4/image 13). No suspicious
pulmonary nodules.

No suspicious mediastinal lymphadenopathy is visualized.

Soft tissues and osseous structures are unremarkable.
IMPRESSION: Negative CT calcium scoring over-read.

No significant extracardiac findings.

## 2015-07-31 ENCOUNTER — Other Ambulatory Visit: Payer: Self-pay | Admitting: Internal Medicine

## 2015-07-31 NOTE — Telephone Encounter (Signed)
Rx(s) sent to pharmacy electronically.  

## 2015-09-04 ENCOUNTER — Other Ambulatory Visit: Payer: Self-pay | Admitting: Internal Medicine

## 2015-09-04 NOTE — Telephone Encounter (Signed)
Rx request sent to pharmacy.  

## 2015-10-23 ENCOUNTER — Telehealth: Payer: Self-pay | Admitting: Internal Medicine

## 2015-10-23 DIAGNOSIS — E785 Hyperlipidemia, unspecified: Secondary | ICD-10-CM

## 2015-10-23 DIAGNOSIS — Z79899 Other long term (current) drug therapy: Secondary | ICD-10-CM

## 2015-10-23 NOTE — Telephone Encounter (Signed)
Labs ordered. Lab slips mailed to patient. LM for patient that this was MD recommendations.

## 2015-10-23 NOTE — Telephone Encounter (Signed)
LM for patient that I will ask MD if he wants to check any labs and then will contact him back about this.   Last lipids in March 2016.   Routed to Dr. Debara Pickett.

## 2015-10-23 NOTE — Telephone Encounter (Signed)
Please order a regular lipid profile and CMET prior to appointment.  DR. Lemmie Evens

## 2015-10-23 NOTE — Telephone Encounter (Signed)
Pt called in wanting to know if he needs to have some labs drawn for his cholesterol. Please f/u with him  Thanks

## 2016-01-31 ENCOUNTER — Other Ambulatory Visit: Payer: Self-pay | Admitting: *Deleted

## 2016-01-31 MED ORDER — METOPROLOL SUCCINATE ER 25 MG PO TB24: 12.5000 mg | ORAL_TABLET | Freq: Every day | ORAL | Status: DC

## 2016-02-25 ENCOUNTER — Other Ambulatory Visit: Payer: Self-pay | Admitting: Internal Medicine

## 2016-02-26 NOTE — Telephone Encounter (Signed)
Metoprolol succinate refilled 01/31/2016 for 90 day supply

## 2016-02-27 LAB — COMPREHENSIVE METABOLIC PANEL
ALBUMIN: 4.2 g/dL (ref 3.6–5.1)
ALT: 28 U/L (ref 9–46)
AST: 24 U/L (ref 10–35)
Alkaline Phosphatase: 80 U/L (ref 40–115)
BUN: 15 mg/dL (ref 7–25)
CHLORIDE: 105 mmol/L (ref 98–110)
CO2: 26 mmol/L (ref 20–31)
Calcium: 8.9 mg/dL (ref 8.6–10.3)
Creat: 0.79 mg/dL (ref 0.70–1.25)
Glucose, Bld: 109 mg/dL — ABNORMAL HIGH (ref 65–99)
POTASSIUM: 4.8 mmol/L (ref 3.5–5.3)
Sodium: 141 mmol/L (ref 135–146)
TOTAL PROTEIN: 6.4 g/dL (ref 6.1–8.1)
Total Bilirubin: 1 mg/dL (ref 0.2–1.2)

## 2016-02-27 LAB — LIPID PANEL
CHOL/HDL RATIO: 3.7 ratio (ref <=5.0)
Cholesterol: 155 mg/dL (ref 125–200)
HDL: 42 mg/dL (ref >=40)
LDL Cholesterol: 99 mg/dL (ref <130)
TRIGLYCERIDES: 70 mg/dL (ref <150)
VLDL: 14 mg/dL (ref <30)

## 2016-03-01 ENCOUNTER — Other Ambulatory Visit: Payer: Self-pay | Admitting: Internal Medicine

## 2016-03-05 ENCOUNTER — Telehealth: Payer: Self-pay | Admitting: Internal Medicine

## 2016-03-05 NOTE — Telephone Encounter (Signed)
LM on home VM with results

## 2016-03-05 NOTE — Telephone Encounter (Signed)
New message     Returning a call to get lab results.  Ok to leave msg on vm or give them to his wife

## 2016-03-11 ENCOUNTER — Encounter: Payer: Self-pay | Admitting: Internal Medicine

## 2016-03-11 ENCOUNTER — Ambulatory Visit (INDEPENDENT_AMBULATORY_CARE_PROVIDER_SITE_OTHER): Payer: BLUE CROSS/BLUE SHIELD | Admitting: Internal Medicine

## 2016-03-11 VITALS — BP 136/70 | HR 50 | Ht 73.0 in | Wt 237.1 lb

## 2016-03-11 DIAGNOSIS — Z951 Presence of aortocoronary bypass graft: Secondary | ICD-10-CM

## 2016-03-11 DIAGNOSIS — E785 Hyperlipidemia, unspecified: Secondary | ICD-10-CM

## 2016-03-11 DIAGNOSIS — I2510 Atherosclerotic heart disease of native coronary artery without angina pectoris: Secondary | ICD-10-CM | POA: Diagnosis not present

## 2016-03-11 DIAGNOSIS — I10 Essential (primary) hypertension: Secondary | ICD-10-CM | POA: Diagnosis not present

## 2016-03-11 NOTE — Progress Notes (Signed)
OFFICE NOTE  Chief Complaint:  No complaints  Primary Care Physician: Andria Frames, MD  HPI:  [name] West is a very pleasant 65 year old male who's been followed by Dr. Ronnald Ramp. He recently has been having more frequent palpitations and is interested in a cardiac visit to evaluate these as well as his cardiac risk. He tells me that he was initially diagnosed with abnormally high cholesterol in the 1990s. He was then started on Lipitor and has stayed on that since then. A few years ago he is noted to have onset of hypertension and has had some weight gain. He used to be an avid runner, doing about 4 miles 3 times a week. He did then developed the problems and is not exercising as much. He has had some weight gain, particularly around the neck. His wife, who is a Equities trader, noted that he does snore and may actually sleep in different sides of the house. She reports that he has had some witnessed apneic events in the past, but he is never had a sleep study. With regards to his palpitations, he reports feelings of skipped beats which were interpreted as possible PVCs. While he may be having this, an EKG in the office today did catch a short run of PAT which was 3 beats. I suspect this could be the cause of his palpitations. He is not on a beta blocker. Finally his family history is significant for heart disease both in his father and grandfather. He is asymptomatic, denying any chest pain or shortness of breath with exertion.  [name] returns today for followup on his cardiac calcium score. The findings indicated a coronary calcium score of 313, which is 93% compared to age and sex-matched controls. He is also here to followup on his sleep study which she underwent on 12/10/2013. This demonstrated severe obstructive sleep apnea. During the total sleep time of 2 hours and 15 minutes, there were 55.6 AHI.  CPAP titration was recommended.  Based upon his frequent PVCs which are  persistent, recommended a nuclear stress test as there is evidence of multivessel coronary calcium. He underwent a nuclear stress test 01/05/2014. This was an exercise nuclear stress test. He exercised for 9 minutes and achieved 10 metabolic equivalents. Exercise was discontinued due to chest pain, shortness of breath and fatigue. There was 2 mm of horizontal ST segment depression in frequent ventricular ectopy and transient bigeminy noted. The nuclear perfusion images were interpreted as high wrist demonstrating an apical scar with peri-infarct ischemia and basal lateral ischemia.  EF was 46%.  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 The LAD filled distally via collaterals. He was admitted and placed on a heparin drip. Cardiothoracic surgical consultation was obtained with Modesto Charon M.D. he evaluated the patient and studies and agree with recommendations to proceed with coronary artery surgical revascularization. The patient remained medically stable and surgery was scheduled. On 02/02/2014 he was taken the operating room and underwent CABGx6 with left internal mammary artery to LAD, free right internal mammary artery to obtuse marginal 1, sequential saphenous vein graft to the second and third diagonals, sequential saphenous vein graft to distal right coronary and posterior descending. Transesophageal echo revealed ejection fraction and normal wall motion.  Today he returns  and is without complaints. Feeling quite well and is recovering quickly.  [name] returns today and is feeling very well. He completed cardiac rehabilitation successfully and has had no issues. He had one episode of palpitations was doing significant yard work over the summer for several hours which may been related to  dehydration. He has since joined the gym and has been very active exercising 5 days a week. He's had some weight loss and dietary changes. Overall is going in the right direction. I recently added Zetia to his medications and he will be due to have a recheck in 3 months of his cholesterol.  I saw [name] back in the office today. He had no significant complaints. His EKG was noted to be in sinus bradycardia with a heart rate around 45. At his last office visit the heart rate was in the 40s. He says when he goes to gym generally his heart rates around the 60s and is able to get up in the low 100s. Although he is asymptomatic and his blood pressure is normal, I'm concerned about the low rate and that he may be on too much beta blocker. We recently repeated his lipid profile which shows a marked improvement. LDL particle number is now 936, LDL-C is 81, HDL 51, and triglycerides 47. Total cholesterol is 141. This does show optimal control. I also think besides adjustments at his medicine that exercise and weight loss are helping him tremendously.  [name] returns today for follow-up. He denies any chest pain or worsening shortness of breath. Heart rate remains bradycardic but he is asymptomatic with this. He is doing a little less exercise than he normally does but plans to increase it this summer. Weight is gone up slightly. He had recent lab work which shows a total cholesterol of 155, triglycerides 70, HDL 42, and LDL 99. This is on high-dose atorvastatin and zetia and he has no side effects from these medicines.  PMHx:  Past Medical History  Diagnosis Date  . Hyperlipidemia   . Hypertension   . Palpitations     Past Surgical History  Procedure Laterality Date  . Coronary artery bypass graft N/A 02/02/2014    Procedure: CORONARY ARTERY BYPASS GRAFTING (CABG) times six using bilateral mammaries and right saphenous vein.;  Surgeon: Melrose Nakayama, MD;  Location: [place];  Service: Open  Heart Surgery;  Laterality: N/A;  . Intraoperative transesophageal echocardiogram N/A 02/02/2014    Procedure: INTRAOPERATIVE TRANSESOPHAGEAL ECHOCARDIOGRAM;  Surgeon: Melrose Nakayama, MD;  Location: [place];  Service: Open Heart Surgery;  Laterality: N/A;  . Left heart catheterization with coronary angiogram N/A 02/01/2014    Procedure: LEFT HEART CATHETERIZATION WITH CORONARY ANGIOGRAM;  Surgeon: Pixie Casino, MD;  Location: [facility] CATH LAB;  Service: Cardiovascular;  Laterality: N/A;    FAMHx:  Family History  Problem Relation Age of Onset  . Cancer Mother   . Heart disease Father     MI at 42, CABG at 66, pacemaker at 53  . Heart attack Paternal Grandfather 33  . Heart failure Maternal Grandfather 71    SOCHx:   reports that he quit smoking about 42 years ago. He has never used smokeless tobacco. He reports that he drinks about 1.0 - 1.5 oz of alcohol per week. He reports that he does not use illicit drugs.  ALLERGIES:  No Known Allergies  ROS: Pertinent items noted in HPI and remainder of comprehensive ROS otherwise negative.  HOME MEDS: Current Outpatient  Prescriptions  Medication Sig Dispense Refill  . aspirin EC 81 MG tablet Take 81 mg by mouth daily.    Marland Kitchen atorvastatin (LIPITOR) 80 MG tablet TAKE 1 TABLET (80 MG TOTAL) BY MOUTH DAILY. 30 tablet 10  . Cholecalciferol (VITAMIN D-3) 1000 UNITS CAPS Take 1,000 Units by mouth daily.     Marland Kitchen ibuprofen (ADVIL,MOTRIN) 200 MG tablet Take 200 mg by mouth every 6 (six) hours as needed (muscle pain).    . metoprolol succinate (TOPROL-XL) 25 MG 24 hr tablet Take 0.5 tablets (12.5 mg total) by mouth daily. 45 tablet 0  . Multiple Vitamin (MULTIVITAMIN) capsule Take 1 capsule by mouth daily. With A, C, E    . Omega-3 Fatty Acids (FISH OIL) 1200 MG CAPS Take 1,200 mg by mouth daily.     . traMADol (ULTRAM) 50 MG tablet Take 1 tablet (50 mg total) by mouth every 8 (eight) hours as needed for moderate pain. 30 tablet 0  . ZETIA 10 MG tablet  TAKE 1 TABLET (10 MG TOTAL) BY MOUTH DAILY. 30 tablet 0   No current facility-administered medications for this visit.    LABS/IMAGING: No results found for this or any previous visit (from the past 48 hour(s)). No results found.  VITALS: BP 136/70 mmHg  Pulse 50  Ht 6\' 1"  (1.854 m)  Wt 237 lb 1.6 oz (107.548 kg)  BMI 31.29 kg/m2  EXAM: Gen.: Awake, in no apparent distress HEENT: PERRLA, EOMI Lungs: Clear bilaterally Cardiovascular: Regular rate and rhythm, normal S1-S2, no murmur Abdomen: Soft, nontender, positive bowel sounds Extremities: No edema Neurologic: Grossly nonfocal Psychiatric: Normal mood, affect  EKG: Sinus bradycardia at 50  ASSESSMENT: 1. Palpitations-resolved 2. Hyperlipidemia-at goal 3. Hypertension-controlled 4. Family history of premature coronary disease 5. Abnormal coronary calcium score of 313 6. CAD s/p CABG x 6 (see anatomy above)  PLAN: 1.   [name] continues to do well after his bypass surgery. Cholesterol is fairly well-controlled although the LDL could be lower, he is a ready on high-dose atorvastatin and anxiety. There are few options that will lower his cholesterol further. Given the cost and little availability of the PCS K9 inhibitors, did not think that's a reasonable option for him. We will plan to continue his current medications. I've advised exercise and dietary changes to ensure some weight loss over the next year. Follow-up annually or sooner as necessary.  Pixie Casino, MD, [facility] Attending Cardiologist [place] [facility] [place] 03/11/2016, 8:22 AM

## 2016-03-11 NOTE — Patient Instructions (Signed)
Your physician wants you to follow-up in: 1 year with Dr. Hilty. You will receive a reminder letter in the mail two months in advance. If you don't receive a letter, please call our office to schedule the follow-up appointment.  

## 2016-04-06 ENCOUNTER — Other Ambulatory Visit: Payer: Self-pay | Admitting: Internal Medicine

## 2016-04-08 NOTE — Telephone Encounter (Signed)
Rx(s) sent to pharmacy electronically.  

## 2016-06-21 ENCOUNTER — Other Ambulatory Visit: Payer: Self-pay

## 2016-06-21 MED ORDER — ATORVASTATIN CALCIUM 80 MG PO TABS: 80.0000 mg | ORAL_TABLET | Freq: Every day | ORAL | Status: DC

## 2016-06-21 NOTE — Telephone Encounter (Signed)
Rx(s) sent to pharmacy electronically.  

## 2016-06-26 ENCOUNTER — Other Ambulatory Visit: Payer: Self-pay

## 2016-07-04 ENCOUNTER — Other Ambulatory Visit: Payer: Self-pay

## 2016-07-04 MED ORDER — METOPROLOL SUCCINATE ER 25 MG PO TB24: 12.5000 mg | ORAL_TABLET | Freq: Every day | ORAL | Status: DC

## 2016-07-05 ENCOUNTER — Other Ambulatory Visit: Payer: Self-pay | Admitting: *Deleted

## 2016-07-05 MED ORDER — METOPROLOL SUCCINATE ER 25 MG PO TB24: 12.5000 mg | ORAL_TABLET | Freq: Every day | ORAL | Status: DC

## 2016-07-05 NOTE — Telephone Encounter (Signed)
Rx has been sent to the pharmacy electronically. ° °

## 2016-10-08 ENCOUNTER — Other Ambulatory Visit: Payer: Self-pay | Admitting: Internal Medicine

## 2016-10-22 DIAGNOSIS — Z23 Encounter for immunization: Secondary | ICD-10-CM | POA: Diagnosis not present

## 2016-11-21 ENCOUNTER — Ambulatory Visit: Payer: Self-pay | Admitting: Internal Medicine

## 2017-01-06 ENCOUNTER — Other Ambulatory Visit: Payer: Self-pay | Admitting: *Deleted

## 2017-01-06 DIAGNOSIS — Z79899 Other long term (current) drug therapy: Secondary | ICD-10-CM

## 2017-01-06 DIAGNOSIS — E785 Hyperlipidemia, unspecified: Secondary | ICD-10-CM

## 2017-02-17 DIAGNOSIS — E785 Hyperlipidemia, unspecified: Secondary | ICD-10-CM | POA: Diagnosis not present

## 2017-02-17 DIAGNOSIS — Z79899 Other long term (current) drug therapy: Secondary | ICD-10-CM | POA: Diagnosis not present

## 2017-02-18 LAB — COMPREHENSIVE METABOLIC PANEL
ALT: 32 U/L (ref 9–46)
AST: 23 U/L (ref 10–35)
Albumin: 4.2 g/dL (ref 3.6–5.1)
Alkaline Phosphatase: 82 U/L (ref 40–115)
BUN: 14 mg/dL (ref 7–25)
CHLORIDE: 107 mmol/L (ref 98–110)
CO2: 28 mmol/L (ref 20–31)
CREATININE: 0.91 mg/dL (ref 0.70–1.25)
Calcium: 9.2 mg/dL (ref 8.6–10.3)
Glucose, Bld: 109 mg/dL — ABNORMAL HIGH (ref 65–99)
POTASSIUM: 5 mmol/L (ref 3.5–5.3)
SODIUM: 141 mmol/L (ref 135–146)
Total Bilirubin: 0.7 mg/dL (ref 0.2–1.2)
Total Protein: 6.6 g/dL (ref 6.1–8.1)

## 2017-02-18 LAB — LIPID PANEL
CHOL/HDL RATIO: 3.5 ratio (ref <5.0)
Cholesterol: 135 mg/dL (ref <200)
HDL: 39 mg/dL — AB (ref >40)
LDL CALC: 81 mg/dL (ref <100)
Triglycerides: 76 mg/dL (ref <150)
VLDL: 15 mg/dL (ref <30)

## 2017-02-24 ENCOUNTER — Encounter: Payer: Self-pay | Admitting: Internal Medicine

## 2017-02-24 ENCOUNTER — Ambulatory Visit (INDEPENDENT_AMBULATORY_CARE_PROVIDER_SITE_OTHER): Payer: Medicare Other | Admitting: Internal Medicine

## 2017-02-24 VITALS — BP 148/80 | HR 54 | Ht 73.0 in | Wt 241.0 lb

## 2017-02-24 DIAGNOSIS — Z951 Presence of aortocoronary bypass graft: Secondary | ICD-10-CM

## 2017-02-24 DIAGNOSIS — I10 Essential (primary) hypertension: Secondary | ICD-10-CM | POA: Diagnosis not present

## 2017-02-24 DIAGNOSIS — E785 Hyperlipidemia, unspecified: Secondary | ICD-10-CM | POA: Diagnosis not present

## 2017-02-24 DIAGNOSIS — I2510 Atherosclerotic heart disease of native coronary artery without angina pectoris: Secondary | ICD-10-CM | POA: Diagnosis not present

## 2017-02-24 NOTE — Patient Instructions (Signed)
Your physician wants you to follow-up in: ONE YEAR with Dr. Hilty. You will receive a reminder letter in the mail two months in advance. If you don't receive a letter, please call our office to schedule the follow-up appointment.  

## 2017-02-25 NOTE — Progress Notes (Signed)
OFFICE NOTE  Chief Complaint:  No complaints  Primary Care Physician: Andria Frames, MD  HPI:  [name] West is a very pleasant 66 year old male who's been followed by Dr. Ronnald Ramp. He recently has been having more frequent palpitations and is interested in a cardiac visit to evaluate these as well as his cardiac risk. He tells me that he was initially diagnosed with abnormally high cholesterol in the 1990s. He was then started on Lipitor and has stayed on that since then. A few years ago he is noted to have onset of hypertension and has had some weight gain. He used to be an avid runner, doing about 4 miles 3 times a week. He did then developed the problems and is not exercising as much. He has had some weight gain, particularly around the neck. His wife, who is a Equities trader, noted that he does snore and may actually sleep in different sides of the house. She reports that he has had some witnessed apneic events in the past, but he is never had a sleep study. With regards to his palpitations, he reports feelings of skipped beats which were interpreted as possible PVCs. While he may be having this, an EKG in the office today did catch a short run of PAT which was 3 beats. I suspect this could be the cause of his palpitations. He is not on a beta blocker. Finally his family history is significant for heart disease both in his father and grandfather. He is asymptomatic, denying any chest pain or shortness of breath with exertion.  [name] West returns today for followup on his cardiac calcium score. The findings indicated a coronary calcium score of 313, which is 93% compared to age and sex-matched controls. He is also here to followup on his sleep study which she underwent on 12/10/2013. This demonstrated severe obstructive sleep apnea. During the total sleep time of 2 hours and 15 minutes, there were 55.6 AHI.  CPAP titration was recommended.  Based upon his frequent PVCs which are  persistent, recommended a nuclear stress test as there is evidence of multivessel coronary calcium. He underwent a nuclear stress test 01/05/2014. This was an exercise nuclear stress test. He exercised for 9 minutes and achieved 10 metabolic equivalents. Exercise was discontinued due to chest pain, shortness of breath and fatigue. There was 2 mm of horizontal ST segment depression in frequent ventricular ectopy and transient bigeminy noted. The nuclear perfusion images were interpreted as high wrist demonstrating an apical scar with peri-infarct ischemia and basal lateral ischemia.  EF was 46%.  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 The LAD filled distally via collaterals. He was admitted and placed on a heparin drip. Cardiothoracic surgical consultation was obtained with Modesto Charon M.D. he evaluated the patient and studies and agree with recommendations to proceed with coronary artery surgical revascularization. The patient remained medically stable and surgery was scheduled. On 02/02/2014 he was taken the operating room and underwent CABGx6 with left internal mammary artery to LAD, free right internal mammary artery to obtuse marginal 1, sequential saphenous vein graft to the second and third diagonals, sequential saphenous vein graft to distal right coronary and posterior descending. Transesophageal echo revealed ejection fraction and normal wall motion.  Today he returns  and is without complaints. Feeling quite well and is recovering quickly.  [name] West returns today and is feeling very well. He completed cardiac rehabilitation successfully and has had no issues. He had one episode of palpitations was doing significant yard work over the summer for several hours which may been related to  dehydration. He has since joined the gym and has been very active exercising 5 days a week. He's had some weight loss and dietary changes. Overall is going in the right direction. I recently added Zetia to his medications and he will be due to have a recheck in 3 months of his cholesterol.  I saw [name] West back in the office today. He had no significant complaints. His EKG was noted to be in sinus bradycardia with a heart rate around 45. At his last office visit the heart rate was in the 40s. He says when he goes to gym generally his heart rates around the 60s and is able to get up in the low 100s. Although he is asymptomatic and his blood pressure is normal, I'm concerned about the low rate and that he may be on too much beta blocker. We recently repeated his lipid profile which shows a marked improvement. LDL particle number is now 936, LDL-C is 81, HDL 51, and triglycerides 47. Total cholesterol is 141. This does show optimal control. I also think besides adjustments at his medicine that exercise and weight loss are helping him tremendously.  [name] West returns today for follow-up. He denies any chest pain or worsening shortness of breath. Heart rate remains bradycardic but he is asymptomatic with this. He is doing a little less exercise than he normally does but plans to increase it this summer. Weight is gone up slightly. He had recent lab work which shows a total cholesterol of 155, triglycerides 70, HDL 42, and LDL 99. This is on high-dose atorvastatin and zetia and he has no side effects from these medicines.  02/24/2017  [name] West continues to do well. He is without complaints. Blood pressure is mildly elevated 148/80, recheck came down to 126/78. He denies any chest pain or worsening shortness of breath. He has had a nice improvement in overall cholesterol on high-dose atorvastatin 80 mg and ezetimibe 10 mg daily. Recent cholesterol profile in February demonstrated total cholesterol 135, HDL  39, LDL-C 81, triglycerides 76 and non-HDL cholesterol of 96. Although his non-HDL cholesterol is at goal, LDL-C could be slightly better at less than 70. We discussed possibly adding PC SK 9 inhibitor although his control is very good and he could stand to lose a little weight. That may make a difference with his cholesterol. He wants to try that first and is not interested in additional medication at this time  PMHx:  Past Medical History:  Diagnosis Date  . Hyperlipidemia   . Hypertension   . Palpitations     Past Surgical History:  Procedure Laterality Date  . CORONARY ARTERY BYPASS GRAFT N/A 02/02/2014   Procedure: CORONARY ARTERY BYPASS GRAFTING (CABG) times six using bilateral mammaries and right saphenous vein.;  Surgeon: Melrose Nakayama, MD;  Location: [place];  Service: Open Heart Surgery;  Laterality: N/A;  . INTRAOPERATIVE TRANSESOPHAGEAL ECHOCARDIOGRAM N/A 02/02/2014   Procedure: INTRAOPERATIVE TRANSESOPHAGEAL ECHOCARDIOGRAM;  Surgeon: Melrose Nakayama, MD;  Location: [place];  Service: Open Heart Surgery;  Laterality: N/A;  . LEFT HEART CATHETERIZATION WITH CORONARY ANGIOGRAM N/A 02/01/2014   Procedure: LEFT HEART CATHETERIZATION WITH CORONARY ANGIOGRAM;  Surgeon: Chrissie Noa  C. Everett Ehrler, MD;  Location: [place] CATH LAB;  Service: Cardiovascular;  Laterality: N/A;    FAMHx:  Family History  Problem Relation Age of Onset  . Cancer Mother   . Heart disease Father     MI at 79, CABG at 3, pacemaker at 46  . Heart attack Paternal Grandfather 37  . Heart failure Maternal Grandfather 2    SOCHx:   reports that he quit smoking about 43 years ago. He has never used smokeless tobacco. He reports that he drinks about 1.0 - 1.5 oz of alcohol per week . He reports that he does not use drugs.  ALLERGIES:  No Known Allergies  ROS: Pertinent items noted in HPI and remainder of comprehensive ROS otherwise negative.  HOME MEDS: Current Outpatient Prescriptions  Medication Sig Dispense  Refill  . aspirin EC 81 MG tablet Take 81 mg by mouth daily.    Marland Kitchen atorvastatin (LIPITOR) 80 MG tablet Take 1 tablet (80 mg total) by mouth daily at 6 PM. 30 tablet 8  . Cholecalciferol (VITAMIN D-3) 1000 UNITS CAPS Take 1,000 Units by mouth daily.     Marland Kitchen ibuprofen (ADVIL,MOTRIN) 200 MG tablet Take 200 mg by mouth every 6 (six) hours as needed (muscle pain).    . metoprolol succinate (TOPROL-XL) 25 MG 24 hr tablet Take 0.5 tablets (12.5 mg total) by mouth daily. 45 tablet 2  . Multiple Vitamin (MULTIVITAMIN) capsule Take 1 capsule by mouth daily. With A, C, E    . Omega-3 Fatty Acids (FISH OIL) 1200 MG CAPS Take 1,200 mg by mouth daily.     . traMADol (ULTRAM) 50 MG tablet Take 1 tablet (50 mg total) by mouth every 8 (eight) hours as needed for moderate pain. 30 tablet 0  . ZETIA 10 MG tablet TAKE 1 TABLET (10 MG TOTAL) BY MOUTH DAILY. 30 tablet 11   No current facility-administered medications for this visit.     LABS/IMAGING: No results found for this or any previous visit (from the past 48 hour(s)). No results found.  VITALS: BP (!) 148/80   Pulse (!) 54   Ht 6\' 1"  (1.854 m)   Wt 241 lb (109.3 kg)   BMI 31.80 kg/m   EXAM: Gen.: Awake, in no apparent distress HEENT: PERRLA, EOMI Lungs: Clear bilaterally Cardiovascular: Regular rate and rhythm, normal S1-S2, no murmur Abdomen: Soft, nontender, positive bowel sounds Extremities: No edema Neurologic: Grossly nonfocal Psychiatric: Normal mood, affect  EKG: Sinus bradycardia at 54  ASSESSMENT: 1. Palpitations-resolved 2. Hyperlipidemia-at goal 3. Hypertension-controlled 4. Family history of premature coronary disease 5. Abnormal coronary calcium score of 313 6. CAD s/p CABG x 6 (see anatomy above)  PLAN: 1.   [name] West is doing well and is asymptomatic. His cholesterol is basically goal although he could stand to lose some weight. Blood pressure is controlled. No changes to his medications today. Of encouraged healthy  diet and exercise with a goal of 10-15 pound weight loss over the next 6 months. Follow-up with me annually or sooner as necessary.  Pixie Casino, MD, [facility] Attending Cardiologist [place] [facility] [place] 02/25/2017, 5:48 PM

## 2017-03-10 ENCOUNTER — Other Ambulatory Visit: Payer: Self-pay

## 2017-03-10 MED ORDER — ATORVASTATIN CALCIUM 80 MG PO TABS: 80.0000 mg | ORAL_TABLET | Freq: Every day | ORAL | 11 refills | Status: DC

## 2017-04-01 ENCOUNTER — Other Ambulatory Visit: Payer: Self-pay

## 2017-04-01 MED ORDER — EZETIMIBE 10 MG PO TABS: 10.0000 mg | ORAL_TABLET | Freq: Every day | ORAL | 11 refills | Status: DC

## 2017-04-01 NOTE — Telephone Encounter (Signed)
Rx(s) sent to pharmacy electronically.  

## 2017-09-26 ENCOUNTER — Other Ambulatory Visit: Payer: Self-pay

## 2017-09-26 MED ORDER — METOPROLOL SUCCINATE ER 25 MG PO TB24: 12.5000 mg | ORAL_TABLET | Freq: Every day | ORAL | 3 refills | Status: DC

## 2017-10-27 DIAGNOSIS — K047 Periapical abscess without sinus: Secondary | ICD-10-CM | POA: Diagnosis not present

## 2017-10-27 DIAGNOSIS — K122 Cellulitis and abscess of mouth: Secondary | ICD-10-CM | POA: Diagnosis not present

## 2017-11-15 DIAGNOSIS — Z23 Encounter for immunization: Secondary | ICD-10-CM | POA: Diagnosis not present

## 2017-12-29 ENCOUNTER — Other Ambulatory Visit: Payer: Self-pay | Admitting: *Deleted

## 2017-12-29 DIAGNOSIS — E785 Hyperlipidemia, unspecified: Secondary | ICD-10-CM

## 2017-12-29 DIAGNOSIS — Z951 Presence of aortocoronary bypass graft: Secondary | ICD-10-CM

## 2017-12-29 DIAGNOSIS — Z79899 Other long term (current) drug therapy: Secondary | ICD-10-CM

## 2018-02-23 DIAGNOSIS — E785 Hyperlipidemia, unspecified: Secondary | ICD-10-CM | POA: Diagnosis not present

## 2018-02-23 DIAGNOSIS — Z79899 Other long term (current) drug therapy: Secondary | ICD-10-CM | POA: Diagnosis not present

## 2018-02-23 DIAGNOSIS — Z951 Presence of aortocoronary bypass graft: Secondary | ICD-10-CM | POA: Diagnosis not present

## 2018-02-23 LAB — COMPREHENSIVE METABOLIC PANEL
A/G RATIO: 2.4 — AB (ref 1.2–2.2)
ALT: 39 IU/L (ref 0–44)
AST: 27 IU/L (ref 0–40)
Albumin: 4.5 g/dL (ref 3.6–4.8)
Alkaline Phosphatase: 88 IU/L (ref 39–117)
BILIRUBIN TOTAL: 1 mg/dL (ref 0.0–1.2)
BUN / CREAT RATIO: 16 (ref 10–24)
BUN: 14 mg/dL (ref 8–27)
CHLORIDE: 106 mmol/L (ref 96–106)
CO2: 22 mmol/L (ref 20–29)
Calcium: 9.2 mg/dL (ref 8.6–10.2)
Creatinine, Ser: 0.86 mg/dL (ref 0.76–1.27)
GFR calc non Af Amer: 90 mL/min/{1.73_m2} (ref >59)
GFR, EST AFRICAN AMERICAN: 104 mL/min/{1.73_m2} (ref >59)
Globulin, Total: 1.9 g/dL (ref 1.5–4.5)
Glucose: 113 mg/dL — ABNORMAL HIGH (ref 65–99)
Potassium: 4.6 mmol/L (ref 3.5–5.2)
Sodium: 142 mmol/L (ref 134–144)
TOTAL PROTEIN: 6.4 g/dL (ref 6.0–8.5)

## 2018-02-23 LAB — LIPID PANEL
Chol/HDL Ratio: 3.4 ratio (ref 0.0–5.0)
Cholesterol, Total: 134 mg/dL (ref 100–199)
HDL: 39 mg/dL — AB (ref >39)
LDL Calculated: 80 mg/dL (ref 0–99)
Triglycerides: 74 mg/dL (ref 0–149)
VLDL Cholesterol Cal: 15 mg/dL (ref 5–40)

## 2018-03-02 ENCOUNTER — Ambulatory Visit (INDEPENDENT_AMBULATORY_CARE_PROVIDER_SITE_OTHER): Payer: Medicare Other | Admitting: Internal Medicine

## 2018-03-02 ENCOUNTER — Encounter: Payer: Self-pay | Admitting: Internal Medicine

## 2018-03-02 VITALS — BP 122/60 | HR 51 | Ht 72.0 in | Wt 244.4 lb

## 2018-03-02 DIAGNOSIS — Z951 Presence of aortocoronary bypass graft: Secondary | ICD-10-CM | POA: Diagnosis not present

## 2018-03-02 DIAGNOSIS — I2510 Atherosclerotic heart disease of native coronary artery without angina pectoris: Secondary | ICD-10-CM

## 2018-03-02 DIAGNOSIS — E782 Mixed hyperlipidemia: Secondary | ICD-10-CM | POA: Diagnosis not present

## 2018-03-02 DIAGNOSIS — I10 Essential (primary) hypertension: Secondary | ICD-10-CM

## 2018-03-02 NOTE — Progress Notes (Signed)
OFFICE NOTE  Chief Complaint:  No complaints  Primary Care Physician: Kristie Cowman, MD  HPI:  [name] West is a very pleasant 67 year old male who's been followed by Dr. Ronnald Ramp. He recently has been having more frequent palpitations and is interested in a cardiac visit to evaluate these as well as his cardiac risk. He tells me that he was initially diagnosed with abnormally high cholesterol in the 1990s. He was then started on Lipitor and has stayed on that since then. A few years ago he is noted to have onset of hypertension and has had some weight gain. He used to be an avid runner, doing about 4 miles 3 times a week. He did then developed the problems and is not exercising as much. He has had some weight gain, particularly around the neck. His wife, who is a Equities trader, noted that he does snore and may actually sleep in different sides of the house. She reports that he has had some witnessed apneic events in the past, but he is never had a sleep study. With regards to his palpitations, he reports feelings of skipped beats which were interpreted as possible PVCs. While he may be having this, an EKG in the office today did catch a short run of PAT which was 3 beats. I suspect this could be the cause of his palpitations. He is not on a beta blocker. Finally his family history is significant for heart disease both in his father and grandfather. He is asymptomatic, denying any chest pain or shortness of breath with exertion.  [name] West returns today for followup on his cardiac calcium score. The findings indicated a coronary calcium score of 313, which is 93% compared to age and sex-matched controls. He is also here to followup on his sleep study which she underwent on 12/10/2013. This demonstrated severe obstructive sleep apnea. During the total sleep time of 2 hours and 15 minutes, there were 55.6 AHI.  CPAP titration was recommended.  Based upon his frequent PVCs which are  persistent, recommended a nuclear stress test as there is evidence of multivessel coronary calcium. He underwent a nuclear stress test 01/05/2014. This was an exercise nuclear stress test. He exercised for 9 minutes and achieved 10 metabolic equivalents. Exercise was discontinued due to chest pain, shortness of breath and fatigue. There was 2 mm of horizontal ST segment depression in frequent ventricular ectopy and transient bigeminy noted. The nuclear perfusion images were interpreted as high wrist demonstrating an apical scar with peri-infarct ischemia and basal lateral ischemia.  EF was 46%.  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 The LAD filled distally via collaterals. He was admitted and placed on a heparin drip. Cardiothoracic surgical consultation was obtained with Modesto Charon M.D. he evaluated the patient and studies and agree with recommendations to proceed with coronary artery surgical revascularization. The patient remained medically stable and surgery was scheduled. On 02/02/2014 he was taken the operating room and underwent CABGx6 with left internal mammary artery to LAD, free right internal mammary artery to obtuse marginal 1, sequential saphenous vein graft to the second and third diagonals, sequential saphenous vein graft to distal right coronary and posterior descending. Transesophageal echo revealed ejection fraction and normal wall motion.  Today he returns  and is without complaints. Feeling quite well and is recovering quickly.  [name] West returns today and is feeling very well. He completed cardiac rehabilitation successfully and has had no issues. He had one episode of palpitations was doing significant yard work over the summer for several hours which may been related to  dehydration. He has since joined the gym and has been very active exercising 5 days a week. He's had some weight loss and dietary changes. Overall is going in the right direction. I recently added Zetia to his medications and he will be due to have a recheck in 3 months of his cholesterol.  I saw [name] West back in the office today. He had no significant complaints. His EKG was noted to be in sinus bradycardia with a heart rate around 45. At his last office visit the heart rate was in the 40s. He says when he goes to gym generally his heart rates around the 60s and is able to get up in the low 100s. Although he is asymptomatic and his blood pressure is normal, I'm concerned about the low rate and that he may be on too much beta blocker. We recently repeated his lipid profile which shows a marked improvement. LDL particle number is now 936, LDL-C is 81, HDL 51, and triglycerides 47. Total cholesterol is 141. This does show optimal control. I also think besides adjustments at his medicine that exercise and weight loss are helping him tremendously.  [name] West returns today for follow-up. He denies any chest pain or worsening shortness of breath. Heart rate remains bradycardic but he is asymptomatic with this. He is doing a little less exercise than he normally does but plans to increase it this summer. Weight is gone up slightly. He had recent lab work which shows a total cholesterol of 155, triglycerides 70, HDL 42, and LDL 99. This is on high-dose atorvastatin and zetia and he has no side effects from these medicines.  02/24/2017  [name] West continues to do well. He is without complaints. Blood pressure is mildly elevated 148/80, recheck came down to 126/78. He denies any chest pain or worsening shortness of breath. He has had a nice improvement in overall cholesterol on high-dose atorvastatin 80 mg and ezetimibe 10 mg daily. Recent cholesterol profile in February demonstrated total cholesterol 135, HDL  39, LDL-C 81, triglycerides 76 and non-HDL cholesterol of 96. Although his non-HDL cholesterol is at goal, LDL-C could be slightly better at less than 70. We discussed possibly adding PC SK 9 inhibitor although his control is very good and he could stand to lose a little weight. That may make a difference with his cholesterol. He wants to try that first and is not interested in additional medication at this time.  03/02/2018  [name] West returns today for follow-up.  Overall he continues to be without complaints.  He does a lot of strenuous yard work and exercise.  He says he can be active for up to 4-5 hours at a time.  He generally takes some breaks.  He denies any chest pain or worsening shortness of breath  Blood pressure is been very well controlled and is 122/60 today.  Recent lipid profile however showed total cholesterol 134, triglycerides 74, HDL 39 and LDL-C of 80.  His LDL is lower than it has been in the past however remains elevated above target of 70.  We talked about aggressive diet and weight loss as previously recommended over the past several years but there  have been no significant changes in that standpoint.  Based on the data with PCS K9 inhibitors, he is an ideal patient for treatment.  His LDL remains above goal and is similar to those patients at baseline who are enrolled in clinical trials.  In addition he has documented multivessel coronary disease with prior CABG.  We discussed the possibility of starting a PCSK9 inhibitor today and he seemed to be interested in it.  PMHx:  Past Medical History:  Diagnosis Date  . Hyperlipidemia   . Hypertension   . Palpitations     Past Surgical History:  Procedure Laterality Date  . CORONARY ARTERY BYPASS GRAFT N/A 02/02/2014   Procedure: CORONARY ARTERY BYPASS GRAFTING (CABG) times six using bilateral mammaries and right saphenous vein.;  Surgeon: Melrose Nakayama, MD;  Location: [facility];  Service: Open Heart Surgery;  Laterality: N/A;   . INTRAOPERATIVE TRANSESOPHAGEAL ECHOCARDIOGRAM N/A 02/02/2014   Procedure: INTRAOPERATIVE TRANSESOPHAGEAL ECHOCARDIOGRAM;  Surgeon: Melrose Nakayama, MD;  Location: [facility];  Service: Open Heart Surgery;  Laterality: N/A;  . LEFT HEART CATHETERIZATION WITH CORONARY ANGIOGRAM N/A 02/01/2014   Procedure: LEFT HEART CATHETERIZATION WITH CORONARY ANGIOGRAM;  Surgeon: Pixie Casino, MD;  Location: [facility] CATH LAB;  Service: Cardiovascular;  Laterality: N/A;    FAMHx:  Family History  Problem Relation Age of Onset  . Cancer Mother   . Heart disease Father        MI at 80, CABG at 40, pacemaker at 67  . Heart attack Paternal Grandfather 7  . Heart failure Maternal Grandfather 57    SOCHx:   reports that he quit smoking about 44 years ago. he has never used smokeless tobacco. He reports that he drinks about 1.0 - 1.5 oz of alcohol per week. He reports that he does not use drugs.  ALLERGIES:  No Known Allergies  ROS: Pertinent items noted in HPI and remainder of comprehensive ROS otherwise negative.  HOME MEDS: Current Outpatient Medications  Medication Sig Dispense Refill  . aspirin EC 81 MG tablet Take 81 mg by mouth daily.    Marland Kitchen atorvastatin (LIPITOR) 80 MG tablet Take 1 tablet (80 mg total) by mouth daily at 6 PM. 30 tablet 11  . Cholecalciferol (VITAMIN D-3) 1000 UNITS CAPS Take 1,000 Units by mouth daily.     Marland Kitchen ezetimibe (ZETIA) 10 MG tablet Take 1 tablet (10 mg total) by mouth daily. 30 tablet 11  . ibuprofen (ADVIL,MOTRIN) 200 MG tablet Take 200 mg by mouth every 6 (six) hours as needed (muscle pain).    . metoprolol succinate (TOPROL-XL) 25 MG 24 hr tablet Take 0.5 tablets (12.5 mg total) by mouth daily. 45 tablet 3  . Multiple Vitamin (MULTIVITAMIN) capsule Take 1 capsule by mouth daily. With A, C, E    . Omega-3 Fatty Acids (FISH OIL) 1200 MG CAPS Take 1,200 mg by mouth daily.     . traMADol (ULTRAM) 50 MG tablet Take 1 tablet (50 mg total) by mouth every 8 (eight) hours as  needed for moderate pain. 30 tablet 0   No current facility-administered medications for this visit.     LABS/IMAGING: No results found for this or any previous visit (from the past 48 hour(s)). No results found.  VITALS: BP 122/60   Pulse (!) 51   Ht 6' (1.829 m)   Wt 244 lb 6.4 oz (110.9 kg)   BMI 33.15 kg/m   EXAM: General appearance: alert and no distress Neck: no carotid bruit, no  JVD and thyroid not enlarged, symmetric, no tenderness/mass/nodules Lungs: clear to auscultation bilaterally Heart: regular rate and rhythm Abdomen: soft, non-tender; bowel sounds normal; no masses,  no organomegaly and obese Extremities: extremities normal, atraumatic, no cyanosis or edema Pulses: 2+ and symmetric Skin: Skin color, texture, turgor normal. No rashes or lesions Neurologic: Grossly normal Psych: Pleasant  EKG: Sinus bradycardia with PVCs at 51-personally reviewed  ASSESSMENT: 1. Palpitations-occasional PVC's 2. Hyperlipidemia 3. Hypertension-controlled 4. Family history of premature coronary disease 5. CAD s/p CABG x 6 (see anatomy above) - 01/2014  PLAN: 1.   [name] West continues to be very active and is asymptomatic with regards to his coronary disease.  He had bypass surgery just over 4 years ago and is doing well.  His LDL however is not at goal.  He is not been able to lose the weight and make significant dietary changes over the past couple of years.  I think it is time to consider additional treatment.  He is a good candidate for PCSK9 inhibitor, and actually a prototypical patient that was enrolled in the trials with baseline LDL cholesterol in the 80s on max tolerated therapy with atorvastatin 80 mg daily and ezetimibe 10 mg daily.  By adding PCSK9 inhibitor, we may be able to achieve another 25-30% relative risk reduction of cardiovascular events which is significant.  I would recommend starting Repatha every 2 weeks.  I demonstrated the use of the autoinjector in the  office today.  We will start paperwork for that shortly. Repeat lipid profile in 3 months after starting medication.  Follow-up with me in 6 months in lipid clinic.  Pixie Casino, MD, [facility], [place] Director of the Advanced Lipid Disorders &  Cardiovascular Risk Reduction Clinic Diplomate of the American Board of Clinical Lipidology Attending Cardiologist  Direct Dial: [phone]  Fax: [phone]  Website:  [email or web address] 03/02/2018, 9:03 AM

## 2018-03-02 NOTE — Patient Instructions (Signed)
Medication Instructions:  Continue current medications  If you need a refill on your cardiac medications before your next appointment, please call your pharmacy.  Labwork: None Ordered    Testing/Procedures: None Ordered  Follow-Up: Your physician wants you to follow-up in: 6 Months lipid clinic. You should receive a reminder letter in the mail two months in advance. If you do not receive a letter, please call our office [phone].   Thank you for choosing [facility] at [facility]!!

## 2018-03-05 ENCOUNTER — Telehealth: Payer: Self-pay | Admitting: Internal Medicine

## 2018-03-05 ENCOUNTER — Other Ambulatory Visit: Payer: Self-pay | Admitting: Internal Medicine

## 2018-03-05 NOTE — Telephone Encounter (Signed)
LMTCB   Need insurance Bin, PCN, RxGroup number in order to select correct prior authorization form for Repatha

## 2018-03-06 NOTE — Telephone Encounter (Signed)
REFILL 

## 2018-03-08 ENCOUNTER — Other Ambulatory Visit: Payer: Self-pay | Admitting: Internal Medicine

## 2018-03-09 NOTE — Telephone Encounter (Signed)
One year - repeat cholesterol at that time.  Dr. Lemmie Evens

## 2018-03-09 NOTE — Telephone Encounter (Signed)
Follow up ° ° °Returning call for nurse ° ° °

## 2018-03-09 NOTE — Telephone Encounter (Signed)
Patient returned call. He states he reviewed his lab results in [place] from years past and discussed with his wife Repatha and decided he does NOT want to start this medication at this time. Stressed importance of goal LDL given heart history and advised he work on diet/exercise in addition to current medications.   He would like to know if he should f/up in 6 months as planned or 1 year as he generally has, with labs prior?

## 2018-03-09 NOTE — Telephone Encounter (Signed)
Sent patient a message in [place] requesting clarification on insurance info.

## 2018-03-09 NOTE — Telephone Encounter (Signed)
Message sent to patient in [place] with MD recommendations about follow up

## 2018-04-04 ENCOUNTER — Other Ambulatory Visit: Payer: Self-pay | Admitting: Internal Medicine

## 2018-08-03 DIAGNOSIS — X32XXXA Exposure to sunlight, initial encounter: Secondary | ICD-10-CM | POA: Diagnosis not present

## 2018-08-03 DIAGNOSIS — L918 Other hypertrophic disorders of the skin: Secondary | ICD-10-CM | POA: Diagnosis not present

## 2018-08-03 DIAGNOSIS — L570 Actinic keratosis: Secondary | ICD-10-CM | POA: Diagnosis not present

## 2018-08-03 DIAGNOSIS — D225 Melanocytic nevi of trunk: Secondary | ICD-10-CM | POA: Diagnosis not present

## 2018-08-03 DIAGNOSIS — L821 Other seborrheic keratosis: Secondary | ICD-10-CM | POA: Diagnosis not present

## 2018-09-12 ENCOUNTER — Other Ambulatory Visit: Payer: Self-pay | Admitting: Internal Medicine

## 2018-12-12 ENCOUNTER — Other Ambulatory Visit: Payer: Self-pay | Admitting: Internal Medicine

## 2018-12-25 ENCOUNTER — Other Ambulatory Visit: Payer: Self-pay | Admitting: Internal Medicine

## 2019-01-11 ENCOUNTER — Other Ambulatory Visit: Payer: Self-pay | Admitting: *Deleted

## 2019-01-11 DIAGNOSIS — Z951 Presence of aortocoronary bypass graft: Secondary | ICD-10-CM

## 2019-01-11 DIAGNOSIS — E782 Mixed hyperlipidemia: Secondary | ICD-10-CM

## 2019-02-11 ENCOUNTER — Other Ambulatory Visit: Payer: Self-pay | Admitting: Internal Medicine

## 2019-02-12 NOTE — Telephone Encounter (Signed)
Rx has been sent to the pharmacy electronically. ° °

## 2019-02-22 DIAGNOSIS — Z951 Presence of aortocoronary bypass graft: Secondary | ICD-10-CM | POA: Diagnosis not present

## 2019-02-22 DIAGNOSIS — E782 Mixed hyperlipidemia: Secondary | ICD-10-CM | POA: Diagnosis not present

## 2019-02-22 DIAGNOSIS — Z23 Encounter for immunization: Secondary | ICD-10-CM | POA: Diagnosis not present

## 2019-02-22 LAB — COMPREHENSIVE METABOLIC PANEL
ALT: 38 IU/L (ref 0–44)
AST: 27 IU/L (ref 0–40)
Albumin/Globulin Ratio: 2 (ref 1.2–2.2)
Albumin: 4.3 g/dL (ref 3.8–4.8)
Alkaline Phosphatase: 101 IU/L (ref 39–117)
BUN/Creatinine Ratio: 13 (ref 10–24)
BUN: 11 mg/dL (ref 8–27)
Bilirubin Total: 0.8 mg/dL (ref 0.0–1.2)
CO2: 23 mmol/L (ref 20–29)
Calcium: 9.3 mg/dL (ref 8.6–10.2)
Chloride: 102 mmol/L (ref 96–106)
Creatinine, Ser: 0.88 mg/dL (ref 0.76–1.27)
GFR calc Af Amer: 103 mL/min/{1.73_m2} (ref >59)
GFR calc non Af Amer: 89 mL/min/{1.73_m2} (ref >59)
Globulin, Total: 2.2 g/dL (ref 1.5–4.5)
Glucose: 115 mg/dL — ABNORMAL HIGH (ref 65–99)
Potassium: 4.7 mmol/L (ref 3.5–5.2)
Sodium: 141 mmol/L (ref 134–144)
TOTAL PROTEIN: 6.5 g/dL (ref 6.0–8.5)

## 2019-02-22 LAB — LIPID PANEL
Chol/HDL Ratio: 3.6 ratio (ref 0.0–5.0)
Cholesterol, Total: 140 mg/dL (ref 100–199)
HDL: 39 mg/dL — ABNORMAL LOW (ref >39)
LDL Calculated: 89 mg/dL (ref 0–99)
Triglycerides: 59 mg/dL (ref 0–149)
VLDL CHOLESTEROL CAL: 12 mg/dL (ref 5–40)

## 2019-03-22 ENCOUNTER — Ambulatory Visit: Payer: Medicare Other | Admitting: Internal Medicine

## 2019-03-25 ENCOUNTER — Other Ambulatory Visit: Payer: Self-pay | Admitting: Internal Medicine

## 2019-05-19 ENCOUNTER — Other Ambulatory Visit: Payer: Self-pay | Admitting: Internal Medicine

## 2019-06-08 ENCOUNTER — Telehealth: Payer: Self-pay | Admitting: Internal Medicine

## 2019-06-08 NOTE — Telephone Encounter (Signed)
LMTCB,to change appt to virtual visit with Dr. Debara Pickett.

## 2019-06-14 ENCOUNTER — Ambulatory Visit: Payer: Medicare Other | Admitting: Internal Medicine

## 2019-06-24 ENCOUNTER — Other Ambulatory Visit: Payer: Self-pay

## 2019-06-24 MED ORDER — ATORVASTATIN CALCIUM 80 MG PO TABS: ORAL_TABLET | ORAL | 3 refills | Status: DC

## 2019-07-22 ENCOUNTER — Ambulatory Visit (INDEPENDENT_AMBULATORY_CARE_PROVIDER_SITE_OTHER): Payer: Medicare Other | Admitting: Internal Medicine

## 2019-07-22 ENCOUNTER — Encounter: Payer: Self-pay | Admitting: Internal Medicine

## 2019-07-22 ENCOUNTER — Other Ambulatory Visit: Payer: Self-pay

## 2019-07-22 VITALS — BP 132/80 | HR 50 | Temp 97.3°F | Ht 73.0 in | Wt 234.2 lb

## 2019-07-22 DIAGNOSIS — I2510 Atherosclerotic heart disease of native coronary artery without angina pectoris: Secondary | ICD-10-CM | POA: Diagnosis not present

## 2019-07-22 DIAGNOSIS — Z951 Presence of aortocoronary bypass graft: Secondary | ICD-10-CM

## 2019-07-22 DIAGNOSIS — I10 Essential (primary) hypertension: Secondary | ICD-10-CM | POA: Diagnosis not present

## 2019-07-22 DIAGNOSIS — E782 Mixed hyperlipidemia: Secondary | ICD-10-CM

## 2019-07-22 NOTE — Patient Instructions (Signed)
Medication Instructions:  Your physician recommends that you continue on your current medications as directed. Please refer to the Current Medication list given to you today.  If you need a refill on your cardiac medications before your next appointment, please call your pharmacy.    Follow-Up: At [facility], you and your health needs are our priority.  As part of our continuing mission to provide you with exceptional heart care, we have created designated Provider Care Teams.  These Care Teams include your primary Cardiologist (physician) and Advanced Practice Providers (APPs -  Physician Assistants and Nurse Practitioners) who all work together to provide you with the care you need, when you need it. You will need a follow up appointment in 12 months.  Please call our office 2 months in advance to schedule this appointment.  You may see Kenneth C Hilty, MD or one of the following Advanced Practice Providers on your designated Care Team: Hao Meng, PA-C . Angela Duke, PA-C  Any Other Special Instructions Will Be Listed Below (If Applicable).    

## 2019-07-22 NOTE — Progress Notes (Signed)
OFFICE NOTE  Chief Complaint:  No complaints  Primary Care Physician: Kristie Cowman, MD  HPI:  [name] West is a very pleasant 68 year old male who's been followed by Dr. Ronnald Ramp. He recently has been having more frequent palpitations and is interested in a cardiac visit to evaluate these as well as his cardiac risk. He tells me that he was initially diagnosed with abnormally high cholesterol in the 1990s. He was then started on Lipitor and has stayed on that since then. A few years ago he is noted to have onset of hypertension and has had some weight gain. He used to be an avid runner, doing about 4 miles 3 times a week. He did then developed the problems and is not exercising as much. He has had some weight gain, particularly around the neck. His wife, who is a Equities trader, noted that he does snore and may actually sleep in different sides of the house. She reports that he has had some witnessed apneic events in the past, but he is never had a sleep study. With regards to his palpitations, he reports feelings of skipped beats which were interpreted as possible PVCs. While he may be having this, an EKG in the office today did catch a short run of PAT which was 3 beats. I suspect this could be the cause of his palpitations. He is not on a beta blocker. Finally his family history is significant for heart disease both in his father and grandfather. He is asymptomatic, denying any chest pain or shortness of breath with exertion.  [name] West returns today for followup on his cardiac calcium score. The findings indicated a coronary calcium score of 313, which is 93% compared to age and sex-matched controls. He is also here to followup on his sleep study which she underwent on 12/10/2013. This demonstrated severe obstructive sleep apnea. During the total sleep time of 2 hours and 15 minutes, there were 55.6 AHI.  CPAP titration was recommended.  Based upon his frequent PVCs which are  persistent, recommended a nuclear stress test as there is evidence of multivessel coronary calcium. He underwent a nuclear stress test 01/05/2014. This was an exercise nuclear stress test. He exercised for 9 minutes and achieved 10 metabolic equivalents. Exercise was discontinued due to chest pain, shortness of breath and fatigue. There was 2 mm of horizontal ST segment depression in frequent ventricular ectopy and transient bigeminy noted. The nuclear perfusion images were interpreted as high wrist demonstrating an apical scar with peri-infarct ischemia and basal lateral ischemia.  EF was 46%.  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 The LAD filled distally via collaterals. He was admitted and placed on a heparin drip. Cardiothoracic surgical consultation was obtained with Modesto Charon M.D. he evaluated the patient and studies and agree with recommendations to proceed with coronary artery surgical revascularization. The patient remained medically stable and surgery was scheduled. On 02/02/2014 he was taken the operating room and underwent CABGx6 with left internal mammary artery to LAD, free right internal mammary artery to obtuse marginal 1, sequential saphenous vein graft to the second and third diagonals, sequential saphenous vein graft to distal right coronary and posterior descending. Transesophageal echo revealed ejection fraction and normal wall motion.  Today he returns  and is without complaints. Feeling quite well and is recovering quickly.  [name] West returns today and is feeling very well. He completed cardiac rehabilitation successfully and has had no issues. He had one episode of palpitations was doing significant yard work over the summer for several hours which may been related to  dehydration. He has since joined the gym and has been very active exercising 5 days a week. He's had some weight loss and dietary changes. Overall is going in the right direction. I recently added Zetia to his medications and he will be due to have a recheck in 3 months of his cholesterol.  I saw [name] West back in the office today. He had no significant complaints. His EKG was noted to be in sinus bradycardia with a heart rate around 45. At his last office visit the heart rate was in the 40s. He says when he goes to gym generally his heart rates around the 60s and is able to get up in the low 100s. Although he is asymptomatic and his blood pressure is normal, I'm concerned about the low rate and that he may be on too much beta blocker. We recently repeated his lipid profile which shows a marked improvement. LDL particle number is now 936, LDL-C is 81, HDL 51, and triglycerides 47. Total cholesterol is 141. This does show optimal control. I also think besides adjustments at his medicine that exercise and weight loss are helping him tremendously.  [name] West returns today for follow-up. He denies any chest pain or worsening shortness of breath. Heart rate remains bradycardic but he is asymptomatic with this. He is doing a little less exercise than he normally does but plans to increase it this summer. Weight is gone up slightly. He had recent lab work which shows a total cholesterol of 155, triglycerides 70, HDL 42, and LDL 99. This is on high-dose atorvastatin and zetia and he has no side effects from these medicines.  02/24/2017  [name] West continues to do well. He is without complaints. Blood pressure is mildly elevated 148/80, recheck came down to 126/78. He denies any chest pain or worsening shortness of breath. He has had a nice improvement in overall cholesterol on high-dose atorvastatin 80 mg and ezetimibe 10 mg daily. Recent cholesterol profile in February demonstrated total cholesterol 135, HDL  39, LDL-C 81, triglycerides 76 and non-HDL cholesterol of 96. Although his non-HDL cholesterol is at goal, LDL-C could be slightly better at less than 70. We discussed possibly adding PC SK 9 inhibitor although his control is very good and he could stand to lose a little weight. That may make a difference with his cholesterol. He wants to try that first and is not interested in additional medication at this time.  03/02/2018  [name] West returns today for follow-up.  Overall he continues to be without complaints.  He does a lot of strenuous yard work and exercise.  He says he can be active for up to 4-5 hours at a time.  He generally takes some breaks.  He denies any chest pain or worsening shortness of breath  Blood pressure is been very well controlled and is 122/60 today.  Recent lipid profile however showed total cholesterol 134, triglycerides 74, HDL 39 and LDL-C of 80.  His LDL is lower than it has been in the past however remains elevated above target of 70.  We talked about aggressive diet and weight loss as previously recommended over the past several years but there  have been no significant changes in that standpoint.  Based on the data with PCS K9 inhibitors, he is an ideal patient for treatment.  His LDL remains above goal and is similar to those patients at baseline who are enrolled in clinical trials.  In addition he has documented multivessel coronary disease with prior CABG.  We discussed the possibility of starting a PCSK9 inhibitor today and he seemed to be interested in it.  07/22/2019  [name] West is seen today in routine annual follow-up.  Overall he continues to do well.  He is asymptomatic denies any chest pain or worsening shortness of breath.  Cholesterol has remained fairly stable with LDL 89.  Is been as low around 80 but is never been below 70.  We previously talked about additional medication since he is maxed out on atorvastatin and ezetimibe.  Also talked about a PCSK9  inhibitor but he was not interested in it.  He felt like he was doing well enough where he is at.  Therefore we will continue his current medicines.  He has no anginal symptoms or worsening shortness of breath.  PMHx:  Past Medical History:  Diagnosis Date  . Hyperlipidemia   . Hypertension   . Palpitations     Past Surgical History:  Procedure Laterality Date  . CORONARY ARTERY BYPASS GRAFT N/A 02/02/2014   Procedure: CORONARY ARTERY BYPASS GRAFTING (CABG) times six using bilateral mammaries and right saphenous vein.;  Surgeon: Melrose Nakayama, MD;  Location: [place];  Service: Open Heart Surgery;  Laterality: N/A;  . INTRAOPERATIVE TRANSESOPHAGEAL ECHOCARDIOGRAM N/A 02/02/2014   Procedure: INTRAOPERATIVE TRANSESOPHAGEAL ECHOCARDIOGRAM;  Surgeon: Melrose Nakayama, MD;  Location: [place];  Service: Open Heart Surgery;  Laterality: N/A;  . LEFT HEART CATHETERIZATION WITH CORONARY ANGIOGRAM N/A 02/01/2014   Procedure: LEFT HEART CATHETERIZATION WITH CORONARY ANGIOGRAM;  Surgeon: Pixie Casino, MD;  Location: [facility] CATH LAB;  Service: Cardiovascular;  Laterality: N/A;    FAMHx:  Family History  Problem Relation Age of Onset  . Cancer Mother   . Heart disease Father        MI at 58, CABG at 32, pacemaker at 57  . Heart attack Paternal Grandfather 11  . Heart failure Maternal Grandfather 14    SOCHx:   reports that he quit smoking about 45 years ago. He has never used smokeless tobacco. He reports current alcohol use of about 2.0 - 3.0 standard drinks of alcohol per week. He reports that he does not use drugs.  ALLERGIES:  No Known Allergies  ROS: Pertinent items noted in HPI and remainder of comprehensive ROS otherwise negative.  HOME MEDS: Current Outpatient Medications  Medication Sig Dispense Refill  . aspirin EC 81 MG tablet Take 81 mg by mouth daily.    Marland Kitchen atorvastatin (LIPITOR) 80 MG tablet TAKE 1 TABLET(80 MG) BY MOUTH DAILY AT 6 PM 30 tablet 3  . Cholecalciferol  (VITAMIN D-3) 1000 UNITS CAPS Take 1,000 Units by mouth daily.     Marland Kitchen ezetimibe (ZETIA) 10 MG tablet TAKE 1 TABLET(10 MG) BY MOUTH DAILY 90 tablet 2  . ibuprofen (ADVIL,MOTRIN) 200 MG tablet Take 200 mg by mouth every 6 (six) hours as needed (muscle pain).    . metoprolol succinate (TOPROL-XL) 25 MG 24 hr tablet TAKE 1/2 TABLET(12.5 MG) BY MOUTH DAILY 45 tablet 1  . Multiple Vitamin (MULTIVITAMIN) capsule Take 1 capsule by mouth daily. With A, C, E    . Omega-3 Fatty Acids (FISH OIL) 1200  MG CAPS Take 1,200 mg by mouth daily.     . traMADol (ULTRAM) 50 MG tablet Take 1 tablet (50 mg total) by mouth every 8 (eight) hours as needed for moderate pain. 30 tablet 0   No current facility-administered medications for this visit.     LABS/IMAGING: No results found for this or any previous visit (from the past 48 hour(s)). No results found.  VITALS: BP 132/80   Pulse (!) 50   Temp (!) 97.3 F (36.3 C)   Ht 6\' 1"  (1.854 m)   Wt 234 lb 3.2 oz (106.2 kg)   SpO2 95%   BMI 30.90 kg/m   EXAM: General appearance: alert and no distress Neck: no carotid bruit, no JVD and thyroid not enlarged, symmetric, no tenderness/mass/nodules Lungs: clear to auscultation bilaterally Heart: regular rate and rhythm Abdomen: soft, non-tender; bowel sounds normal; no masses,  no organomegaly and obese Extremities: extremities normal, atraumatic, no cyanosis or edema Pulses: 2+ and symmetric Skin: Skin color, texture, turgor normal. No rashes or lesions Neurologic: Grossly normal Psych: Pleasant  EKG: Sinus bradycardia at 51, low voltage QRS-personally reviewed  ASSESSMENT: 1. Palpitations-occasional PVC's 2. Hyperlipidemia 3. Hypertension-controlled 4. Family history of premature coronary disease 5. CAD s/p CABG x 6 (see anatomy above) - 01/2014  PLAN: 1.   Mr. Kendall continues to do well now about 6 years after surgery.  He denies any significant PVCs.  His cholesterols been reasonably well-controlled  and he is declined additional therapy to drive LDL less than 70.  His blood pressure is also well controlled.  No other changes to his medicines today.  Follow-up with me annually or sooner as necessary.  Pixie Casino, MD, [facility], [place] Director of the Advanced Lipid Disorders &  Cardiovascular Risk Reduction Clinic Diplomate of the American Board of Clinical Lipidology Attending Cardiologist  Direct Dial: [phone]  Fax: [phone]  Website:  [email or web address] 07/22/2019, 4:06 PM

## 2019-07-26 DIAGNOSIS — H6123 Impacted cerumen, bilateral: Secondary | ICD-10-CM | POA: Diagnosis not present

## 2019-07-26 DIAGNOSIS — R42 Dizziness and giddiness: Secondary | ICD-10-CM | POA: Diagnosis not present

## 2019-10-06 DIAGNOSIS — Z23 Encounter for immunization: Secondary | ICD-10-CM | POA: Diagnosis not present

## 2019-10-20 ENCOUNTER — Other Ambulatory Visit: Payer: Self-pay | Admitting: Internal Medicine

## 2019-10-25 ENCOUNTER — Other Ambulatory Visit: Payer: Self-pay | Admitting: Internal Medicine

## 2019-11-12 ENCOUNTER — Other Ambulatory Visit: Payer: Self-pay | Admitting: Internal Medicine

## 2020-04-24 ENCOUNTER — Other Ambulatory Visit: Payer: Self-pay | Admitting: Internal Medicine

## 2020-05-07 ENCOUNTER — Other Ambulatory Visit: Payer: Self-pay | Admitting: Internal Medicine

## 2020-05-16 DIAGNOSIS — Z1283 Encounter for screening for malignant neoplasm of skin: Secondary | ICD-10-CM | POA: Diagnosis not present

## 2020-05-16 DIAGNOSIS — L820 Inflamed seborrheic keratosis: Secondary | ICD-10-CM | POA: Diagnosis not present

## 2020-05-16 DIAGNOSIS — B078 Other viral warts: Secondary | ICD-10-CM | POA: Diagnosis not present

## 2020-05-16 DIAGNOSIS — L821 Other seborrheic keratosis: Secondary | ICD-10-CM | POA: Diagnosis not present

## 2020-05-16 DIAGNOSIS — D225 Melanocytic nevi of trunk: Secondary | ICD-10-CM | POA: Diagnosis not present

## 2020-06-24 ENCOUNTER — Other Ambulatory Visit: Payer: Self-pay | Admitting: Internal Medicine

## 2020-07-31 ENCOUNTER — Other Ambulatory Visit: Payer: Self-pay | Admitting: Internal Medicine

## 2020-08-23 DIAGNOSIS — L570 Actinic keratosis: Secondary | ICD-10-CM | POA: Diagnosis not present

## 2020-08-23 DIAGNOSIS — L820 Inflamed seborrheic keratosis: Secondary | ICD-10-CM | POA: Diagnosis not present

## 2020-08-23 DIAGNOSIS — X32XXXD Exposure to sunlight, subsequent encounter: Secondary | ICD-10-CM | POA: Diagnosis not present

## 2020-09-03 ENCOUNTER — Other Ambulatory Visit: Payer: Self-pay | Admitting: Internal Medicine

## 2020-09-11 ENCOUNTER — Other Ambulatory Visit: Payer: Self-pay | Admitting: Internal Medicine

## 2020-09-21 DIAGNOSIS — Z23 Encounter for immunization: Secondary | ICD-10-CM | POA: Diagnosis not present

## 2020-09-27 ENCOUNTER — Telehealth: Payer: Self-pay | Admitting: Internal Medicine

## 2020-09-27 DIAGNOSIS — Z951 Presence of aortocoronary bypass graft: Secondary | ICD-10-CM

## 2020-09-27 DIAGNOSIS — E782 Mixed hyperlipidemia: Secondary | ICD-10-CM | POA: Diagnosis not present

## 2020-09-27 NOTE — Telephone Encounter (Signed)
Patient of Dr. Debara Pickett who walked in to [facility] office for lab work today He has an appointment next week There were no labs ordered at last years visit, thus no pending/active orders Upon chart review, he has been getting FLP/CMET - I placed orders for this He will be advised to come back for lab work in fasting state

## 2020-09-28 LAB — COMPREHENSIVE METABOLIC PANEL
ALT: 34 IU/L (ref 0–44)
AST: 29 IU/L (ref 0–40)
Albumin/Globulin Ratio: 2.2 (ref 1.2–2.2)
Albumin: 4.6 g/dL (ref 3.8–4.8)
Alkaline Phosphatase: 92 IU/L (ref 44–121)
BUN/Creatinine Ratio: 17 (ref 10–24)
BUN: 14 mg/dL (ref 8–27)
Bilirubin Total: 1.1 mg/dL (ref 0.0–1.2)
CO2: 23 mmol/L (ref 20–29)
Calcium: 9.4 mg/dL (ref 8.6–10.2)
Chloride: 104 mmol/L (ref 96–106)
Creatinine, Ser: 0.83 mg/dL (ref 0.76–1.27)
GFR calc Af Amer: 105 mL/min/{1.73_m2} (ref >59)
GFR calc non Af Amer: 90 mL/min/{1.73_m2} (ref >59)
Globulin, Total: 2.1 g/dL (ref 1.5–4.5)
Glucose: 112 mg/dL — ABNORMAL HIGH (ref 65–99)
Potassium: 5 mmol/L (ref 3.5–5.2)
Sodium: 140 mmol/L (ref 134–144)
Total Protein: 6.7 g/dL (ref 6.0–8.5)

## 2020-09-28 LAB — LIPID PANEL
Chol/HDL Ratio: 3.6 ratio (ref 0.0–5.0)
Cholesterol, Total: 137 mg/dL (ref 100–199)
HDL: 38 mg/dL — ABNORMAL LOW (ref >39)
LDL Chol Calc (NIH): 86 mg/dL (ref 0–99)
Triglycerides: 62 mg/dL (ref 0–149)
VLDL Cholesterol Cal: 13 mg/dL (ref 5–40)

## 2020-10-03 ENCOUNTER — Ambulatory Visit (INDEPENDENT_AMBULATORY_CARE_PROVIDER_SITE_OTHER): Payer: Medicare Other | Admitting: Internal Medicine

## 2020-10-03 ENCOUNTER — Other Ambulatory Visit: Payer: Self-pay

## 2020-10-03 ENCOUNTER — Encounter: Payer: Self-pay | Admitting: Internal Medicine

## 2020-10-03 VITALS — BP 134/82 | HR 53 | Ht 72.0 in | Wt 236.0 lb

## 2020-10-03 DIAGNOSIS — I2510 Atherosclerotic heart disease of native coronary artery without angina pectoris: Secondary | ICD-10-CM

## 2020-10-03 DIAGNOSIS — I10 Essential (primary) hypertension: Secondary | ICD-10-CM | POA: Diagnosis not present

## 2020-10-03 DIAGNOSIS — E782 Mixed hyperlipidemia: Secondary | ICD-10-CM | POA: Diagnosis not present

## 2020-10-03 DIAGNOSIS — Z951 Presence of aortocoronary bypass graft: Secondary | ICD-10-CM | POA: Diagnosis not present

## 2020-10-03 MED ORDER — METOPROLOL SUCCINATE ER 25 MG PO TB24
12.5000 mg | ORAL_TABLET | Freq: Every day | ORAL | 3 refills | Status: DC
Start: 2020-10-03 — End: 2021-09-13

## 2020-10-03 NOTE — Patient Instructions (Signed)
Medication Instructions:  Your physician recommends that you continue on your current medications as directed. Please refer to the Current Medication list given to you today.  *If you need a refill on your cardiac medications before your next appointment, please call your pharmacy*   Lab Work: FASTING lab work before your next visit  If you have labs (blood work) drawn today and your tests are completely normal, you will receive your results only by: Marland Kitchen MyChart Message (if you have MyChart) OR . A paper copy in the mail If you have any lab test that is abnormal or we need to change your treatment, we will call you to review the results.  Follow-Up: At [facility], you and your health needs are our priority.  As part of our continuing mission to provide you with exceptional heart care, we have created designated Provider Care Teams.  These Care Teams include your primary Cardiologist (physician) and Advanced Practice Providers (APPs -  Physician Assistants and Nurse Practitioners) who all work together to provide you with the care you need, when you need it.  We recommend signing up for the patient portal called "MyChart".  Sign up information is provided on this After Visit Summary.  MyChart is used to connect with patients for Virtual Visits (Telemedicine).  Patients are able to view lab/test results, encounter notes, upcoming appointments, etc.  Non-urgent messages can be sent to your provider as well.   To learn more about what you can do with MyChart, go to [email or web address].    Your next appointment:   12 month(s)  The format for your next appointment:   In Person  Provider:   You may see Pixie Casino, MD or one of the following Advanced Practice Providers on your designated Care Team:    Almyra Deforest, PA-C  Fabian Sharp, PA-C or   Roby Lofts, [place]    Other Instructions

## 2020-10-03 NOTE — Progress Notes (Signed)
OFFICE NOTE  Chief Complaint:  No complaints  Primary Care Physician: Kristie Cowman, MD  HPI:  [name] West is a very pleasant 69 year old male who's been followed by Dr. Ronnald Ramp. He recently has been having more frequent palpitations and is interested in a cardiac visit to evaluate these as well as his cardiac risk. He tells me that he was initially diagnosed with abnormally high cholesterol in the 1990s. He was then started on Lipitor and has stayed on that since then. A few years ago he is noted to have onset of hypertension and has had some weight gain. He used to be an avid runner, doing about 4 miles 3 times a week. He did then developed the problems and is not exercising as much. He has had some weight gain, particularly around the neck. His wife, who is a Equities trader, noted that he does snore and may actually sleep in different sides of the house. She reports that he has had some witnessed apneic events in the past, but he is never had a sleep study. With regards to his palpitations, he reports feelings of skipped beats which were interpreted as possible PVCs. While he may be having this, an EKG in the office today did catch a short run of PAT which was 3 beats. I suspect this could be the cause of his palpitations. He is not on a beta blocker. Finally his family history is significant for heart disease both in his father and grandfather. He is asymptomatic, denying any chest pain or shortness of breath with exertion.  [name] West returns today for followup on his cardiac calcium score. The findings indicated a coronary calcium score of 313, which is 93% compared to age and sex-matched controls. He is also here to followup on his sleep study which she underwent on 12/10/2013. This demonstrated severe obstructive sleep apnea. During the total sleep time of 2 hours and 15 minutes, there were 55.6 AHI.  CPAP titration was recommended.  Based upon his frequent PVCs which are  persistent, recommended a nuclear stress test as there is evidence of multivessel coronary calcium. He underwent a nuclear stress test 01/05/2014. This was an exercise nuclear stress test. He exercised for 9 minutes and achieved 10 metabolic equivalents. Exercise was discontinued due to chest pain, shortness of breath and fatigue. There was 2 mm of horizontal ST segment depression in frequent ventricular ectopy and transient bigeminy noted. The nuclear perfusion images were interpreted as high wrist demonstrating an apical scar with peri-infarct ischemia and basal lateral ischemia.  EF was 46%.  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 The LAD filled distally via collaterals. He was admitted and placed on a heparin drip. Cardiothoracic surgical consultation was obtained with Modesto Charon M.D. he evaluated the patient and studies and agree with recommendations to proceed with coronary artery surgical revascularization. The patient remained medically stable and surgery was scheduled. On 02/02/2014 he was taken the operating room and underwent CABGx6 with left internal mammary artery to LAD, free right internal mammary artery to obtuse marginal 1, sequential saphenous vein graft to the second and third diagonals, sequential saphenous vein graft to distal right coronary and posterior descending. Transesophageal echo revealed ejection fraction and normal wall motion.  Today he returns  and is without complaints. Feeling quite well and is recovering quickly.  [name] West returns today and is feeling very well. He completed cardiac rehabilitation successfully and has had no issues. He had one episode of palpitations was doing significant yard work over the summer for several hours which may been related to  dehydration. He has since joined the gym and has been very active exercising 5 days a week. He's had some weight loss and dietary changes. Overall is going in the right direction. I recently added Zetia to his medications and he will be due to have a recheck in 3 months of his cholesterol.  I saw [name] West back in the office today. He had no significant complaints. His EKG was noted to be in sinus bradycardia with a heart rate around 45. At his last office visit the heart rate was in the 40s. He says when he goes to gym generally his heart rates around the 60s and is able to get up in the low 100s. Although he is asymptomatic and his blood pressure is normal, I'm concerned about the low rate and that he may be on too much beta blocker. We recently repeated his lipid profile which shows a marked improvement. LDL particle number is now 936, LDL-C is 81, HDL 51, and triglycerides 47. Total cholesterol is 141. This does show optimal control. I also think besides adjustments at his medicine that exercise and weight loss are helping him tremendously.  [name] West returns today for follow-up. He denies any chest pain or worsening shortness of breath. Heart rate remains bradycardic but he is asymptomatic with this. He is doing a little less exercise than he normally does but plans to increase it this summer. Weight is gone up slightly. He had recent lab work which shows a total cholesterol of 155, triglycerides 70, HDL 42, and LDL 99. This is on high-dose atorvastatin and zetia and he has no side effects from these medicines.  02/24/2017  [name] West continues to do well. He is without complaints. Blood pressure is mildly elevated 148/80, recheck came down to 126/78. He denies any chest pain or worsening shortness of breath. He has had a nice improvement in overall cholesterol on high-dose atorvastatin 80 mg and ezetimibe 10 mg daily. Recent cholesterol profile in February demonstrated total cholesterol 135, HDL  39, LDL-C 81, triglycerides 76 and non-HDL cholesterol of 96. Although his non-HDL cholesterol is at goal, LDL-C could be slightly better at less than 70. We discussed possibly adding PC SK 9 inhibitor although his control is very good and he could stand to lose a little weight. That may make a difference with his cholesterol. He wants to try that first and is not interested in additional medication at this time.  03/02/2018  [name] West returns today for follow-up.  Overall he continues to be without complaints.  He does a lot of strenuous yard work and exercise.  He says he can be active for up to 4-5 hours at a time.  He generally takes some breaks.  He denies any chest pain or worsening shortness of breath  Blood pressure is been very well controlled and is 122/60 today.  Recent lipid profile however showed total cholesterol 134, triglycerides 74, HDL 39 and LDL-C of 80.  His LDL is lower than it has been in the past however remains elevated above target of 70.  We talked about aggressive diet and weight loss as previously recommended over the past several years but there  have been no significant changes in that standpoint.  Based on the data with PCS K9 inhibitors, he is an ideal patient for treatment.  His LDL remains above goal and is similar to those patients at baseline who are enrolled in clinical trials.  In addition he has documented multivessel coronary disease with prior CABG.  We discussed the possibility of starting a PCSK9 inhibitor today and he seemed to be interested in it.  07/22/2019  [name] West is seen today in routine annual follow-up.  Overall he continues to do well.  He is asymptomatic denies any chest pain or worsening shortness of breath.  Cholesterol has remained fairly stable with LDL 89.  Is been as low around 80 but is never been below 70.  We previously talked about additional medication since he is maxed out on atorvastatin and ezetimibe.  Also talked about a PCSK9  inhibitor but he was not interested in it.  He felt like he was doing well enough where he is at.  Therefore we will continue his current medicines.  He has no anginal symptoms or worsening shortness of breath.  10/03/2020  [name] West is seen today in follow-up.  He again is doing well.  Denies any chest pain or worsening shortness of breath.  He did have recent lab work which appears mostly unchanged compared to prior studies.  Specifically his total cholesterol was 137, triglycerides 62, HDL 38 and LDL of 86.  We discussed his target LDL 70 and again he did not seem interested in adding any additional therapies to lower cholesterol.  He was willing to continue to try to work on diet and more weight loss.  Trolled today.  EKG was personally reviewed and shows a sinus bradycardia at 53.  PMHx:  Past Medical History:  Diagnosis Date  . Hyperlipidemia   . Hypertension   . Palpitations     Past Surgical History:  Procedure Laterality Date  . CORONARY ARTERY BYPASS GRAFT N/A 02/02/2014   Procedure: CORONARY ARTERY BYPASS GRAFTING (CABG) times six using bilateral mammaries and right saphenous vein.;  Surgeon: Melrose Nakayama, MD;  Location: [facility];  Service: Open Heart Surgery;  Laterality: N/A;  . INTRAOPERATIVE TRANSESOPHAGEAL ECHOCARDIOGRAM N/A 02/02/2014   Procedure: INTRAOPERATIVE TRANSESOPHAGEAL ECHOCARDIOGRAM;  Surgeon: Melrose Nakayama, MD;  Location: [facility];  Service: Open Heart Surgery;  Laterality: N/A;  . LEFT HEART CATHETERIZATION WITH CORONARY ANGIOGRAM N/A 02/01/2014   Procedure: LEFT HEART CATHETERIZATION WITH CORONARY ANGIOGRAM;  Surgeon: [name] Casino, MD;  Location: [facility] CATH LAB;  Service: Cardiovascular;  Laterality: N/A;    FAMHx:  Family History  Problem Relation Age of Onset  . Cancer Mother   . Heart disease Father        MI at 18, CABG at 63, pacemaker at 6  . Heart attack Paternal Grandfather 12  . Heart failure Maternal Grandfather 36    SOCHx:    reports that he quit smoking about 46 years ago. He has never used smokeless tobacco. He reports current alcohol use of about 2.0 - 3.0 standard drinks of alcohol per week. He reports that he does not use drugs.  ALLERGIES:  No Known Allergies  ROS: Pertinent items noted in HPI and remainder of comprehensive ROS otherwise negative.  HOME MEDS: Current Outpatient Medications  Medication Sig Dispense Refill  . aspirin EC 81 MG tablet Take 81 mg by mouth daily.    Marland Kitchen atorvastatin (LIPITOR) 80 MG tablet TAKE 1 TABLET BY MOUTH EVERY  DAY AT 6 PM 60 tablet 1  . Cholecalciferol (VITAMIN D-3) 1000 UNITS CAPS Take 1,000 Units by mouth daily.     Marland Kitchen ezetimibe (ZETIA) 10 MG tablet TAKE 1 TABLET BY MOUTH EVERY DAY 90 tablet 1  . ibuprofen (ADVIL,MOTRIN) 200 MG tablet Take 200 mg by mouth every 6 (six) hours as needed (muscle pain).    . metoprolol succinate (TOPROL-XL) 25 MG 24 hr tablet TAKE 1/2 TABLET EVERY DAY 45 tablet 0  . Multiple Vitamin (MULTIVITAMIN) capsule Take 1 capsule by mouth daily. With A, C, E    . Omega-3 Fatty Acids (FISH OIL) 1200 MG CAPS Take 1,200 mg by mouth daily.     . traMADol (ULTRAM) 50 MG tablet Take 1 tablet (50 mg total) by mouth every 8 (eight) hours as needed for moderate pain. 30 tablet 0   No current facility-administered medications for this visit.    LABS/IMAGING: No results found for this or any previous visit (from the past 48 hour(s)). No results found.  VITALS: BP 134/82   Pulse (!) 53   Ht 6' (1.829 m)   Wt 236 lb (107 kg)   SpO2 94%   BMI 32.01 kg/m   EXAM: General appearance: alert and no distress Neck: no carotid bruit, no JVD and thyroid not enlarged, symmetric, no tenderness/mass/nodules Lungs: clear to auscultation bilaterally Heart: regular rate and rhythm Abdomen: soft, non-tender; bowel sounds normal; no masses,  no organomegaly and obese Extremities: extremities normal, atraumatic, no cyanosis or edema Pulses: 2+ and symmetric Skin:  Skin color, texture, turgor normal. No rashes or lesions Neurologic: Grossly normal Psych: Pleasant  EKG: Sinus bradycardia at 53, low voltage QRS-personally reviewed  ASSESSMENT: 1. Palpitations-occasional PVC's 2. Hyperlipidemia 3. Hypertension-controlled 4. Family history of premature coronary disease 5. CAD s/p CABG x 6 (see anatomy above) - 01/2014  PLAN: 1.   [name] West denies any new complaints and overall is feeling well.  He has no palpitations or significant PVCs.  His cholesterol is still higher than target however he does not wish to add any additional medications.  Blood pressure is well controlled.  He is now 6 years post CABG.  No further changes to his medications.  I encouraged him to establish with a PCP for further preventative testing.  Follow-up with me annually or sooner as necessary.  [name] Casino, MD, [facility], [place] Director of the Advanced Lipid Disorders &  Cardiovascular [facility] Reduction Clinic Diplomate of the American Board of Clinical Lipidology Attending Cardiologist  Direct Dial: [phone]  Fax: [phone]  Website:  [email or web address] 10/03/2020, 3:45 PM

## 2020-11-30 ENCOUNTER — Other Ambulatory Visit: Payer: Self-pay | Admitting: Internal Medicine

## 2021-02-05 ENCOUNTER — Other Ambulatory Visit: Payer: Self-pay | Admitting: Internal Medicine

## 2021-06-20 ENCOUNTER — Telehealth: Payer: Self-pay | Admitting: Internal Medicine

## 2021-08-09 ENCOUNTER — Other Ambulatory Visit: Payer: Self-pay | Admitting: Internal Medicine

## 2021-09-13 ENCOUNTER — Other Ambulatory Visit: Payer: Self-pay | Admitting: Internal Medicine

## 2021-11-22 DIAGNOSIS — Z23 Encounter for immunization: Secondary | ICD-10-CM | POA: Diagnosis not present

## 2021-12-17 ENCOUNTER — Other Ambulatory Visit: Payer: Self-pay | Admitting: Internal Medicine

## 2021-12-31 ENCOUNTER — Other Ambulatory Visit: Payer: Self-pay

## 2021-12-31 DIAGNOSIS — Z951 Presence of aortocoronary bypass graft: Secondary | ICD-10-CM

## 2021-12-31 DIAGNOSIS — E782 Mixed hyperlipidemia: Secondary | ICD-10-CM | POA: Diagnosis not present

## 2021-12-31 LAB — COMPREHENSIVE METABOLIC PANEL
ALT: 32 IU/L (ref 0–44)
AST: 26 IU/L (ref 0–40)
Albumin/Globulin Ratio: 2.4 — ABNORMAL HIGH (ref 1.2–2.2)
Albumin: 4.4 g/dL (ref 3.8–4.8)
Alkaline Phosphatase: 97 IU/L (ref 44–121)
BUN/Creatinine Ratio: 17 (ref 10–24)
BUN: 15 mg/dL (ref 8–27)
Bilirubin Total: 0.7 mg/dL (ref 0.0–1.2)
CO2: 24 mmol/L (ref 20–29)
Calcium: 9.3 mg/dL (ref 8.6–10.2)
Chloride: 104 mmol/L (ref 96–106)
Creatinine, Ser: 0.88 mg/dL (ref 0.76–1.27)
Globulin, Total: 1.8 g/dL (ref 1.5–4.5)
Glucose: 107 mg/dL — ABNORMAL HIGH (ref 70–99)
Potassium: 4.9 mmol/L (ref 3.5–5.2)
Sodium: 139 mmol/L (ref 134–144)
Total Protein: 6.2 g/dL (ref 6.0–8.5)
eGFR: 93 mL/min/{1.73_m2} (ref >59)

## 2021-12-31 LAB — LIPID PANEL
Chol/HDL Ratio: 3.2 ratio (ref 0.0–5.0)
Cholesterol, Total: 126 mg/dL (ref 100–199)
HDL: 39 mg/dL — ABNORMAL LOW (ref >39)
LDL Chol Calc (NIH): 77 mg/dL (ref 0–99)
Triglycerides: 43 mg/dL (ref 0–149)
VLDL Cholesterol Cal: 10 mg/dL (ref 5–40)

## 2022-01-07 ENCOUNTER — Encounter: Payer: Self-pay | Admitting: Internal Medicine

## 2022-01-07 ENCOUNTER — Ambulatory Visit (INDEPENDENT_AMBULATORY_CARE_PROVIDER_SITE_OTHER): Payer: Medicare Other | Admitting: Internal Medicine

## 2022-01-07 ENCOUNTER — Other Ambulatory Visit: Payer: Self-pay

## 2022-01-07 VITALS — BP 120/60 | HR 49 | Ht 72.0 in | Wt 230.2 lb

## 2022-01-07 DIAGNOSIS — I2510 Atherosclerotic heart disease of native coronary artery without angina pectoris: Secondary | ICD-10-CM

## 2022-01-07 DIAGNOSIS — E782 Mixed hyperlipidemia: Secondary | ICD-10-CM | POA: Diagnosis not present

## 2022-01-07 DIAGNOSIS — Z951 Presence of aortocoronary bypass graft: Secondary | ICD-10-CM

## 2022-01-07 DIAGNOSIS — I10 Essential (primary) hypertension: Secondary | ICD-10-CM

## 2022-01-07 NOTE — Patient Instructions (Signed)
Medication Instructions:  Your physician recommends that you continue on your current medications as directed. Please refer to the Current Medication list given to you today.  *If you need a refill on your cardiac medications before your next appointment, please call your pharmacy*   Lab Work: FASTING lab work to check cholesterol and metabolic panel in 1 year -- before next appointment   If you have labs (blood work) drawn today and your tests are completely normal, you will receive your results only by: [place] (if you have MyChart) OR A paper copy in the mail If you have any lab test that is abnormal or we need to change your treatment, we will call you to review the results.   Follow-Up: At [facility], you and your health needs are our priority.  As part of our continuing mission to provide you with exceptional heart care, we have created designated Provider Care Teams.  These Care Teams include your primary Cardiologist (physician) and Advanced Practice Providers (APPs -  Physician Assistants and Nurse Practitioners) who all work together to provide you with the care you need, when you need it.  We recommend signing up for the patient portal called "MyChart".  Sign up information is provided on this After Visit Summary.  MyChart is used to connect with patients for Virtual Visits (Telemedicine).  Patients are able to view lab/test results, encounter notes, upcoming appointments, etc.  Non-urgent messages can be sent to your provider as well.   To learn more about what you can do with MyChart, go to [email or web address].    Your next appointment:   12 month(s)  The format for your next appointment:   In Person  Provider:   Pixie Casino, MD {

## 2022-01-07 NOTE — Progress Notes (Signed)
OFFICE NOTE  Chief Complaint:  No complaints  Primary Care Physician: Kristie Cowman, MD  HPI:  [name] West is a very pleasant 71 year old male who's been followed by Dr. Ronnald Ramp. He recently has been having more frequent palpitations and is interested in a cardiac visit to evaluate these as well as his cardiac risk. He tells me that he was initially diagnosed with abnormally high cholesterol in the 1990s. He was then started on Lipitor and has stayed on that since then. A few years ago he is noted to have onset of hypertension and has had some weight gain. He used to be an avid runner, doing about 4 miles 3 times a week. He did then developed the problems and is not exercising as much. He has had some weight gain, particularly around the neck. His wife, who is a Equities trader, noted that he does snore and may actually sleep in different sides of the house. She reports that he has had some witnessed apneic events in the past, but he is never had a sleep study. With regards to his palpitations, he reports feelings of skipped beats which were interpreted as possible PVCs. While he may be having this, an EKG in the office today did catch a short run of PAT which was 3 beats. I suspect this could be the cause of his palpitations. He is not on a beta blocker. Finally his family history is significant for heart disease both in his father and grandfather. He is asymptomatic, denying any chest pain or shortness of breath with exertion.  [name] returns today for followup on his cardiac calcium score. The findings indicated a coronary calcium score of 313, which is 93% compared to age and sex-matched controls. He is also here to followup on his sleep study which she underwent on 12/10/2013. This demonstrated severe obstructive sleep apnea. During the total sleep time of 2 hours and 15 minutes, there were 55.6 AHI.  CPAP titration was recommended.  Based upon his frequent PVCs which are  persistent, recommended a nuclear stress test as there is evidence of multivessel coronary calcium. He underwent a nuclear stress test 01/05/2014. This was an exercise nuclear stress test. He exercised for 9 minutes and achieved 10 metabolic equivalents. Exercise was discontinued due to chest pain, shortness of breath and fatigue. There was 2 mm of horizontal ST segment depression in frequent ventricular ectopy and transient bigeminy noted. The nuclear perfusion images were interpreted as high wrist demonstrating an apical scar with peri-infarct ischemia and basal lateral ischemia.  EF was 46%.  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 The LAD filled distally via collaterals. He was admitted and placed on a heparin drip. Cardiothoracic surgical consultation was obtained with Modesto Charon M.D. he evaluated the patient and studies and agree with recommendations to proceed with coronary artery surgical revascularization. The patient remained medically stable and surgery was scheduled. On 02/02/2014 he was taken the operating room and underwent CABGx6 with left internal mammary artery to LAD, free right internal mammary artery to obtuse marginal 1, sequential saphenous vein graft to the second and third diagonals, sequential saphenous vein graft to distal right coronary and posterior descending. Transesophageal echo revealed ejection fraction and normal wall motion.  Today he returns  and is without complaints. Feeling quite well and is recovering quickly.  [name] returns today and is feeling very well. He completed cardiac rehabilitation successfully and has had no issues. He had one episode of palpitations was doing significant yard work over the summer for several hours which may been related to  dehydration. He has since joined the gym and has been very active exercising 5 days a week. He's had some weight loss and dietary changes. Overall is going in the right direction. I recently added Zetia to his medications and he will be due to have a recheck in 3 months of his cholesterol.  I saw [name] back in the office today. He had no significant complaints. His EKG was noted to be in sinus bradycardia with a heart rate around 45. At his last office visit the heart rate was in the 40s. He says when he goes to gym generally his heart rates around the 60s and is able to get up in the low 100s. Although he is asymptomatic and his blood pressure is normal, I'm concerned about the low rate and that he may be on too much beta blocker. We recently repeated his lipid profile which shows a marked improvement. LDL particle number is now 936, LDL-C is 81, HDL 51, and triglycerides 47. Total cholesterol is 141. This does show optimal control. I also think besides adjustments at his medicine that exercise and weight loss are helping him tremendously.  [name] returns today for follow-up. He denies any chest pain or worsening shortness of breath. Heart rate remains bradycardic but he is asymptomatic with this. He is doing a little less exercise than he normally does but plans to increase it this summer. Weight is gone up slightly. He had recent lab work which shows a total cholesterol of 155, triglycerides 70, HDL 42, and LDL 99. This is on high-dose atorvastatin and zetia and he has no side effects from these medicines.  02/24/2017  [name] continues to do well. He is without complaints. Blood pressure is mildly elevated 148/80, recheck came down to 126/78. He denies any chest pain or worsening shortness of breath. He has had a nice improvement in overall cholesterol on high-dose atorvastatin 80 mg and ezetimibe 10 mg daily. Recent cholesterol profile in February demonstrated total cholesterol 135, HDL  39, LDL-C 81, triglycerides 76 and non-HDL cholesterol of 96. Although his non-HDL cholesterol is at goal, LDL-C could be slightly better at less than 70. We discussed possibly adding PC SK 9 inhibitor although his control is very good and he could stand to lose a little weight. That may make a difference with his cholesterol. He wants to try that first and is not interested in additional medication at this time.  03/02/2018  Mr. Voit returns today for follow-up.  Overall he continues to be without complaints.  He does a lot of strenuous yard work and exercise.  He says he can be active for up to 4-5 hours at a time.  He generally takes some breaks.  He denies any chest pain or worsening shortness of breath  Blood pressure is been very well controlled and is 122/60 today.  Recent lipid profile however showed total cholesterol 134, triglycerides 74, HDL 39 and LDL-C of 80.  His LDL is lower than it has been in the past however remains elevated above target of 70.  We talked about aggressive diet and weight loss as previously recommended over the past several years but there  have been no significant changes in that standpoint.  Based on the data with PCS K9 inhibitors, he is an ideal patient for treatment.  His LDL remains above goal and is similar to those patients at baseline who are enrolled in clinical trials.  In addition he has documented multivessel coronary disease with prior CABG.  We discussed the possibility of starting a PCSK9 inhibitor today and he seemed to be interested in it.  07/22/2019  Mr. Sibal is seen today in routine annual follow-up.  Overall he continues to do well.  He is asymptomatic denies any chest pain or worsening shortness of breath.  Cholesterol has remained fairly stable with LDL 89.  Is been as low around 80 but is never been below 70.  We previously talked about additional medication since he is maxed out on atorvastatin and ezetimibe.  Also talked about a PCSK9  inhibitor but he was not interested in it.  He felt like he was doing well enough where he is at.  Therefore we will continue his current medicines.  He has no anginal symptoms or worsening shortness of breath.  10/03/2020  Mr. Anastasia is seen today in follow-up.  He again is doing well.  Denies any chest pain or worsening shortness of breath.  He did have recent lab work which appears mostly unchanged compared to prior studies.  Specifically his total cholesterol was 137, triglycerides 62, HDL 38 and LDL of 86.  We discussed his target LDL 70 and again he did not seem interested in adding any additional therapies to lower cholesterol.  He was willing to continue to try to work on diet and more weight loss.  Trolled today.  EKG was personally reviewed and shows a sinus bradycardia at 53.  01/07/2022  Mr. Kaigler returns today for follow-up.  This is a routine visit.  He says he is feeling very well.  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  He gets a lot of exercise with this.  He denies any chest pain or shortness of breath.  EKG today shows a sinus bradycardia at 49.  Blood pressure is excellent 120/60.  He just had recent labs including lipid profile showed total cholesterol 126, HDL 39, triglycerides 43 and LDL 77.  PMHx:  Past Medical History:  Diagnosis Date   Hyperlipidemia    Hypertension    Palpitations     Past Surgical History:  Procedure Laterality Date   CORONARY ARTERY BYPASS GRAFT N/A 02/02/2014   Procedure: CORONARY ARTERY BYPASS GRAFTING (CABG) times six using bilateral mammaries and right saphenous vein.;  Surgeon: Melrose Nakayama, MD;  Location: [facility];  Service: Open Heart Surgery;  Laterality: N/A;   INTRAOPERATIVE TRANSESOPHAGEAL ECHOCARDIOGRAM N/A 02/02/2014   Procedure: INTRAOPERATIVE TRANSESOPHAGEAL ECHOCARDIOGRAM;  Surgeon: Melrose Nakayama, MD;  Location: [facility];  Service: Open Heart Surgery;   Laterality: N/A;   LEFT HEART CATHETERIZATION WITH CORONARY ANGIOGRAM N/A 02/01/2014   Procedure: LEFT HEART CATHETERIZATION WITH CORONARY ANGIOGRAM;  Surgeon: Pixie Casino, MD;  Location[facility] CATH LAB;  Service: Cardiovascular;  Laterality: N/A;    FAMHx:  Family History  Problem Relation Age of Onset   Cancer Mother    Heart disease Father        MI at 72, CABG at 31, pacemaker at 11   Heart attack Paternal Grandfather 84   Heart failure Maternal Grandfather 44    SOCHx:   reports that he quit  smoking about 48 years ago. His smoking use included cigarettes. He has never used smokeless tobacco. He reports current alcohol use of about 2.0 - 3.0 standard drinks per week. He reports that he does not use drugs.  ALLERGIES:  No Known Allergies  ROS: Pertinent items noted in HPI and remainder of comprehensive ROS otherwise negative.  HOME MEDS: Current Outpatient Medications  Medication Sig Dispense Refill   aspirin EC 81 MG tablet Take 81 mg by mouth daily.     atorvastatin (LIPITOR) 80 MG tablet TAKE 1 TABLET BY MOUTH EVERY DAY AT 6 PM 90 tablet 3   Cholecalciferol (VITAMIN D-3) 1000 UNITS CAPS Take 1,000 Units by mouth daily.      ezetimibe (ZETIA) 10 MG tablet TAKE 1 TABLET BY MOUTH DAILY 90 tablet 1   ibuprofen (ADVIL,MOTRIN) 200 MG tablet Take 200 mg by mouth every 6 (six) hours as needed (muscle pain).     metoprolol succinate (TOPROL-XL) 25 MG 24 hr tablet TAKE 1/2 TABLET BY MOUTH EVERY DAY 45 tablet 0   Multiple Vitamin (MULTIVITAMIN) capsule Take 1 capsule by mouth daily. With A, C, E     Omega-3 Fatty Acids (FISH OIL) 1200 MG CAPS Take 1,200 mg by mouth daily.      traMADol (ULTRAM) 50 MG tablet Take 1 tablet (50 mg total) by mouth every 8 (eight) hours as needed for moderate pain. 30 tablet 0   No current facility-administered medications for this visit.    LABS/IMAGING: No results found for this or any previous visit (from the past 48 hour(s)). No results  found.  VITALS: BP 120/60    Pulse (!) 49    Ht 6' (1.829 m)    Wt 230 lb 3.2 oz (104.4 kg)    SpO2 96%    BMI 31.22 kg/m   EXAM: General appearance: alert and no distress Neck: no carotid bruit, no JVD and thyroid not enlarged, symmetric, no tenderness/mass/nodules Lungs: clear to auscultation bilaterally Heart: regular rate and rhythm Abdomen: soft, non-tender; bowel sounds normal; no masses,  no organomegaly and obese Extremities: extremities normal, atraumatic, no cyanosis or edema Pulses: 2+ and symmetric Skin: Skin color, texture, turgor normal. No rashes or lesions Neurologic: Grossly normal Psych: Pleasant  EKG: Sinus bradycardia 49-personally reviewed  ASSESSMENT: Palpitations-occasional PVC's Hyperlipidemia Hypertension-controlled Family history of premature coronary disease CAD s/p CABG x 6 (see anatomy above) - 01/2014  PLAN: 1.   Mr. Gunning continues to do very well now almost 8 years after bypass surgery.  He denies any chest pain or worsening shortness of breath.  He says he feels great.  He is very physically active.  Lipids are very well controlled although slightly above target LDL less than 70.  Blood pressure is excellent.  He denies any recurrent PVCs.  Follow-up with me annually or sooner as necessary.  Pixie Casino, MD, [facility], [place] Director of the [facility]  [facility] Diplomate of the American Board of Clinical Lipidology Attending Cardiologist  Direct Dial: [phone]   Fax: [phone]  Website:  [email or web address] 01/07/2022, 9:08 AM

## 2022-01-07 NOTE — Addendum Note (Signed)
Addended by: Fidel Levy on: 01/07/2022 09:25 AM   Modules accepted: Orders

## 2022-01-28 DIAGNOSIS — U071 COVID-19: Secondary | ICD-10-CM | POA: Diagnosis not present

## 2022-02-04 ENCOUNTER — Other Ambulatory Visit: Payer: Self-pay | Admitting: Internal Medicine

## 2022-03-11 ENCOUNTER — Other Ambulatory Visit: Payer: Self-pay | Admitting: Internal Medicine

## 2022-07-27 ENCOUNTER — Other Ambulatory Visit: Payer: Self-pay | Admitting: Internal Medicine

## 2022-08-16 DIAGNOSIS — D2272 Melanocytic nevi of left lower limb, including hip: Secondary | ICD-10-CM | POA: Diagnosis not present

## 2022-08-16 DIAGNOSIS — D225 Melanocytic nevi of trunk: Secondary | ICD-10-CM | POA: Diagnosis not present

## 2022-08-16 DIAGNOSIS — B078 Other viral warts: Secondary | ICD-10-CM | POA: Diagnosis not present

## 2022-08-16 DIAGNOSIS — Z1283 Encounter for screening for malignant neoplasm of skin: Secondary | ICD-10-CM | POA: Diagnosis not present

## 2022-08-16 DIAGNOSIS — D485 Neoplasm of uncertain behavior of skin: Secondary | ICD-10-CM | POA: Diagnosis not present

## 2022-09-24 DIAGNOSIS — D485 Neoplasm of uncertain behavior of skin: Secondary | ICD-10-CM | POA: Diagnosis not present

## 2022-09-24 DIAGNOSIS — L821 Other seborrheic keratosis: Secondary | ICD-10-CM | POA: Diagnosis not present

## 2022-09-24 DIAGNOSIS — D2272 Melanocytic nevi of left lower limb, including hip: Secondary | ICD-10-CM | POA: Diagnosis not present

## 2023-01-17 ENCOUNTER — Other Ambulatory Visit: Payer: Self-pay | Admitting: Internal Medicine

## 2023-01-18 ENCOUNTER — Other Ambulatory Visit: Payer: Self-pay | Admitting: Internal Medicine

## 2023-02-02 ENCOUNTER — Other Ambulatory Visit: Payer: Self-pay | Admitting: Internal Medicine

## 2023-06-10 ENCOUNTER — Telehealth: Payer: Self-pay | Admitting: Internal Medicine

## 2023-06-10 DIAGNOSIS — Z5181 Encounter for therapeutic drug level monitoring: Secondary | ICD-10-CM

## 2023-06-10 DIAGNOSIS — I10 Essential (primary) hypertension: Secondary | ICD-10-CM

## 2023-06-10 DIAGNOSIS — E782 Mixed hyperlipidemia: Secondary | ICD-10-CM

## 2023-06-10 NOTE — Telephone Encounter (Signed)
Ordered LP/CMET per appointment notes Patient is aware and has times [facility] lab open

## 2023-06-10 NOTE — Telephone Encounter (Signed)
Pt called and stated he has an appt on 7/5 for yearly.  He would like for the blood work orders to be put in.  Pt stated he would come [facility].    Best number [phone]

## 2023-06-16 DIAGNOSIS — Z5181 Encounter for therapeutic drug level monitoring: Secondary | ICD-10-CM | POA: Diagnosis not present

## 2023-06-16 DIAGNOSIS — E782 Mixed hyperlipidemia: Secondary | ICD-10-CM | POA: Diagnosis not present

## 2023-06-16 DIAGNOSIS — I10 Essential (primary) hypertension: Secondary | ICD-10-CM | POA: Diagnosis not present

## 2023-06-17 LAB — COMPREHENSIVE METABOLIC PANEL
ALT: 31 IU/L (ref 0–44)
AST: 25 IU/L (ref 0–40)
Albumin: 4.4 g/dL (ref 3.8–4.8)
Alkaline Phosphatase: 89 IU/L (ref 44–121)
BUN/Creatinine Ratio: 23 (ref 10–24)
BUN: 17 mg/dL (ref 8–27)
Bilirubin Total: 0.8 mg/dL (ref 0.0–1.2)
CO2: 22 mmol/L (ref 20–29)
Calcium: 9.2 mg/dL (ref 8.6–10.2)
Chloride: 105 mmol/L (ref 96–106)
Creatinine, Ser: 0.75 mg/dL — ABNORMAL LOW (ref 0.76–1.27)
Globulin, Total: 2.1 g/dL (ref 1.5–4.5)
Glucose: 123 mg/dL — ABNORMAL HIGH (ref 70–99)
Potassium: 5 mmol/L (ref 3.5–5.2)
Sodium: 140 mmol/L (ref 134–144)
Total Protein: 6.5 g/dL (ref 6.0–8.5)
eGFR: 96 mL/min/{1.73_m2} (ref >59)

## 2023-06-17 LAB — LIPID PANEL
Chol/HDL Ratio: 3 ratio (ref 0.0–5.0)
Cholesterol, Total: 140 mg/dL (ref 100–199)
HDL: 46 mg/dL (ref >39)
LDL Chol Calc (NIH): 82 mg/dL (ref 0–99)
Triglycerides: 56 mg/dL (ref 0–149)
VLDL Cholesterol Cal: 12 mg/dL (ref 5–40)

## 2023-06-24 ENCOUNTER — Encounter (HOSPITAL_BASED_OUTPATIENT_CLINIC_OR_DEPARTMENT_OTHER): Payer: Self-pay | Admitting: Internal Medicine

## 2023-06-24 ENCOUNTER — Ambulatory Visit (INDEPENDENT_AMBULATORY_CARE_PROVIDER_SITE_OTHER): Payer: Medicare Other | Admitting: Internal Medicine

## 2023-06-24 VITALS — BP 136/70 | HR 51 | Ht 72.0 in | Wt 237.8 lb

## 2023-06-24 DIAGNOSIS — I10 Essential (primary) hypertension: Secondary | ICD-10-CM

## 2023-06-24 DIAGNOSIS — I2510 Atherosclerotic heart disease of native coronary artery without angina pectoris: Secondary | ICD-10-CM | POA: Diagnosis not present

## 2023-06-24 DIAGNOSIS — E782 Mixed hyperlipidemia: Secondary | ICD-10-CM | POA: Diagnosis not present

## 2023-06-24 DIAGNOSIS — Z951 Presence of aortocoronary bypass graft: Secondary | ICD-10-CM | POA: Diagnosis not present

## 2023-06-24 NOTE — Progress Notes (Signed)
OFFICE NOTE  Chief Complaint:  No complaints  Primary Care Physician: Knox Royalty, MD  HPI:  [name] West is a very pleasant 72 year old male who's been followed by Dr. Yetta Barre. He recently has been having more frequent palpitations and is interested in a cardiac visit to evaluate these as well as his cardiac risk. He tells me that he was initially diagnosed with abnormally high cholesterol in the 1990s. He was then started on Lipitor and has stayed on that since then. A few years ago he is noted to have onset of hypertension and has had some weight gain. He used to be an avid runner, doing about 4 miles 3 times a week. He did then developed the problems and is not exercising as much. He has had some weight gain, particularly around the neck. His wife, who is a Designer, jewellery, noted that he does snore and may actually sleep in different sides of the house. She reports that he has had some witnessed apneic events in the past, but he is never had a sleep study. With regards to his palpitations, he reports feelings of skipped beats which were interpreted as possible PVCs. While he may be having this, an EKG in the office today did catch a short run of PAT which was 3 beats. I suspect this could be the cause of his palpitations. He is not on a beta blocker. Finally his family history is significant for heart disease both in his father and grandfather. He is asymptomatic, denying any chest pain or shortness of breath with exertion.  [name] West returns today for followup on his cardiac calcium score. The findings indicated a coronary calcium score of 313, which is 93% compared to age and sex-matched controls. He is also here to followup on his sleep study which she underwent on 12/10/2013. This demonstrated severe obstructive sleep apnea. During the total sleep time of 2 hours and 15 minutes, there were 55.6 AHI.  CPAP titration was recommended.  Based upon his frequent PVCs which are  persistent, recommended a nuclear stress test as there is evidence of multivessel coronary calcium. He underwent a nuclear stress test 01/05/2014. This was an exercise nuclear stress test. He exercised for 9 minutes and achieved 10 metabolic equivalents. Exercise was discontinued due to chest pain, shortness of breath and fatigue. There was 2 mm of horizontal ST segment depression in frequent ventricular ectopy and transient bigeminy noted. The nuclear perfusion images were interpreted as high wrist demonstrating an apical scar with peri-infarct ischemia and basal lateral ischemia.  EF was 46%.  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 The LAD filled distally via collaterals. He was admitted and placed on a heparin drip. Cardiothoracic surgical consultation was obtained with Charlett Lango M.D. he evaluated the patient and studies and agree with recommendations to proceed with coronary artery surgical revascularization. The patient remained medically stable and surgery was scheduled. On 02/02/2014 he was taken the operating room and underwent CABGx6 with left internal mammary artery to LAD, free right internal mammary artery to obtuse marginal 1, sequential saphenous vein graft to the second and third diagonals, sequential saphenous vein graft to distal right coronary and posterior descending. Transesophageal echo revealed ejection fraction and normal wall motion.  Today he returns and is  without complaints. Feeling quite well and is recovering quickly.  [name] West returns today and is feeling very well. He completed cardiac rehabilitation successfully and has had no issues. He had one episode of palpitations was doing significant yard work over the summer for several hours which may been related to  dehydration. He has since joined the gym and has been very active exercising 5 days a week. He's had some weight loss and dietary changes. Overall is going in the right direction. I recently added Zetia to his medications and he will be due to have a recheck in 3 months of his cholesterol.  I saw [name] West back in the office today. He had no significant complaints. His EKG was noted to be in sinus bradycardia with a heart rate around 45. At his last office visit the heart rate was in the 40s. He says when he goes to gym generally his heart rates around the 60s and is able to get up in the low 100s. Although he is asymptomatic and his blood pressure is normal, I'm concerned about the low rate and that he may be on too much beta blocker. We recently repeated his lipid profile which shows a marked improvement. LDL particle number is now 936, LDL-C is 81, HDL 51, and triglycerides 47. Total cholesterol is 141. This does show optimal control. I also think besides adjustments at his medicine that exercise and weight loss are helping him tremendously.  [name] West returns today for follow-up. He denies any chest pain or worsening shortness of breath. Heart rate remains bradycardic but he is asymptomatic with this. He is doing a little less exercise than he normally does but plans to increase it this summer. Weight is gone up slightly. He had recent lab work which shows a total cholesterol of 155, triglycerides 70, HDL 42, and LDL 99. This is on high-dose atorvastatin and zetia and he has no side effects from these medicines.  02/24/2017  [name] West continues to do well. He is without complaints. Blood pressure is mildly elevated 148/80, recheck came down to 126/78. He denies any chest pain or worsening shortness of breath. He has had a nice improvement in overall cholesterol on high-dose atorvastatin 80 mg and ezetimibe 10 mg daily. Recent cholesterol profile in February demonstrated total cholesterol 135, HDL  39, LDL-C 81, triglycerides 76 and non-HDL cholesterol of 96. Although his non-HDL cholesterol is at goal, LDL-C could be slightly better at less than 70. We discussed possibly adding PC SK 9 inhibitor although his control is very good and he could stand to lose a little weight. That may make a difference with his cholesterol. He wants to try that first and is not interested in additional medication at this time.  03/02/2018  Mr. Lovos returns today for follow-up.  Overall he continues to be without complaints.  He does a lot of strenuous yard work and exercise.  He says he can be active for up to 4-5 hours at a time.  He generally takes some breaks.  He denies any chest pain or worsening shortness of breath  Blood pressure is been very well controlled and is 122/60 today.  Recent lipid profile however showed total cholesterol 134, triglycerides 74, HDL 39 and LDL-C of 80.  His LDL is lower than it has been in the past however remains elevated above target of 70.  We talked about aggressive diet and weight loss as previously recommended over the past several years but there have been  no significant changes in that standpoint.  Based on the data with PCS K9 inhibitors, he is an ideal patient for treatment.  His LDL remains above goal and is similar to those patients at baseline who are enrolled in clinical trials.  In addition he has documented multivessel coronary disease with prior CABG.  We discussed the possibility of starting a PCSK9 inhibitor today and he seemed to be interested in it.  07/22/2019  Mr. Cesare is seen today in routine annual follow-up.  Overall he continues to do well.  He is asymptomatic denies any chest pain or worsening shortness of breath.  Cholesterol has remained fairly stable with LDL 89.  Is been as low around 80 but is never been below 70.  We previously talked about additional medication since he is maxed out on atorvastatin and ezetimibe.  Also talked about a PCSK9  inhibitor but he was not interested in it.  He felt like he was doing well enough where he is at.  Therefore we will continue his current medicines.  He has no anginal symptoms or worsening shortness of breath.  10/03/2020  Mr. Olivares is seen today in follow-up.  He again is doing well.  Denies any chest pain or worsening shortness of breath.  He did have recent lab work which appears mostly unchanged compared to prior studies.  Specifically his total cholesterol was 137, triglycerides 62, HDL 38 and LDL of 86.  We discussed his target LDL 70 and again he did not seem interested in adding any additional therapies to lower cholesterol.  He was willing to continue to try to work on diet and more weight loss.  Trolled today.  EKG was personally reviewed and shows a sinus bradycardia at 53.  01/07/2022  Mr. Morrow returns today for follow-up.  This is a routine visit.  He says he is feeling very well.  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  He gets a lot of exercise with this.  He denies any chest pain or shortness of breath.  EKG today shows a sinus bradycardia at 49.  Blood pressure is excellent 120/60.  He just had recent labs including lipid profile showed total cholesterol 126, HDL 39, triglycerides 43 and LDL 77.  06/24/2023  Mr. Thiam is seen today for annual follow-up.  Overall he says he is doing pretty well.  Denies any chest pain or shortness of breath.  Interestingly notes that his PVCs have completely gone away.  EKG today confirms that.  He had recent lipid testing which showed total cholesterol 140, triglycerides 56, HDL 46 and LDL 82.  His target LDL is less than 70.  He is also noted a recent increase in glucose on his metabolic profile.  He has not seen a primary care provider for "years".  He is going to establish with Dr. Yetta Barre again and get a hemoglobin A1c and send that to [place].  He would like some dietary tips to try to  lower cholesterol further and I would advise checking an LP(a) as he might be a candidate for PCSK9 inhibitor therapy.  Other than that he asked today whether or not it would be safe to use PDE 5 inhibitor such as Viagra and that would be okay in my opinion.  He is not on a nitrate.  PMHx:  Past Medical History:  Diagnosis Date   Hyperlipidemia    Hypertension    Palpitations  Past Surgical History:  Procedure Laterality Date   CORONARY ARTERY BYPASS GRAFT N/A 02/02/2014   Procedure: CORONARY ARTERY BYPASS GRAFTING (CABG) times six using bilateral mammaries and right saphenous vein.;  Surgeon: Loreli Slot, MD;  Location: [facility] OR;  Service: Open Heart Surgery;  Laterality: N/A;   INTRAOPERATIVE TRANSESOPHAGEAL ECHOCARDIOGRAM N/A 02/02/2014   Procedure: INTRAOPERATIVE TRANSESOPHAGEAL ECHOCARDIOGRAM;  Surgeon: Loreli Slot, MD;  Location: [facility] OR;  Service: Open Heart Surgery;  Laterality: N/A;   LEFT HEART CATHETERIZATION WITH CORONARY ANGIOGRAM N/A 02/01/2014   Procedure: LEFT HEART CATHETERIZATION WITH CORONARY ANGIOGRAM;  Surgeon: Chrystie Nose, MD;  Location: [facility] CATH LAB;  Service: Cardiovascular;  Laterality: N/A;    FAMHx:  Family History  Problem Relation Age of Onset   Cancer Mother    Heart disease Father        MI at 44, CABG at 46, pacemaker at 33   Heart attack Paternal Grandfather 9   Heart failure Maternal Grandfather 56    SOCHx:   reports that he quit smoking about 49 years ago. His smoking use included cigarettes. He has never used smokeless tobacco. He reports current alcohol use of about 2.0 - 3.0 standard drinks of alcohol per week. He reports that he does not use drugs.  ALLERGIES:  No Known Allergies  ROS: Pertinent items noted in HPI and remainder of comprehensive ROS otherwise negative.  HOME MEDS: Current Outpatient Medications  Medication Sig Dispense Refill   aspirin EC 81 MG tablet Take 81 mg by mouth daily.     atorvastatin  (LIPITOR) 80 MG tablet TAKE 1 TABLET BY MOUTH EVERY DAY AT 6 PM 90 tablet 3   Cholecalciferol (VITAMIN D-3) 1000 UNITS CAPS Take 1,000 Units by mouth daily.      ezetimibe (ZETIA) 10 MG tablet TAKE 1 TABLET BY MOUTH DAILY 90 tablet 1   ibuprofen (ADVIL,MOTRIN) 200 MG tablet Take 200 mg by mouth every 6 (six) hours as needed (muscle pain).     metoprolol succinate (TOPROL-XL) 25 MG 24 hr tablet TAKE 1/2 TABLET BY MOUTH EVERY DAY 45 tablet 1   Multiple Vitamin (MULTIVITAMIN) capsule Take 1 capsule by mouth daily. With A, C, E     Omega-3 Fatty Acids (FISH OIL) 1200 MG CAPS Take 1,200 mg by mouth daily.      traMADol (ULTRAM) 50 MG tablet Take 1 tablet (50 mg total) by mouth every 8 (eight) hours as needed for moderate pain. 30 tablet 0   No current facility-administered medications for this visit.    LABS/IMAGING: No results found for this or any previous visit (from the past 48 hour(s)). No results found.  VITALS: BP 136/70 (BP Location: Left Arm, Patient Position: Sitting, Cuff Size: Large)   Pulse (!) 51   Ht 6' (1.829 m)   Wt 237 lb 12.8 oz (107.9 kg)   BMI 32.25 kg/m   EXAM: General appearance: alert and no distress Neck: no carotid bruit, no JVD and thyroid not enlarged, symmetric, no tenderness/mass/nodules Lungs: clear to auscultation bilaterally Heart: regular rate and rhythm Abdomen: soft, non-tender; bowel sounds normal; no masses,  no organomegaly and obese Extremities: extremities normal, atraumatic, no cyanosis or edema Pulses: 2+ and symmetric Skin: Skin color, texture, turgor normal. No rashes or lesions Neurologic: Grossly normal Psych: Pleasant  EKG: EKG Interpretation Date/Time:  Tuesday June 24 2023 14:07:55 EDT Ventricular Rate:  51 PR Interval:  186 QRS Duration:  98 QT Interval:  456 QTC Calculation: 420 R Axis:  31  Text Interpretation: Sinus bradycardia Low voltage QRS When compared with ECG of 03-Feb-2014 07:27, Premature ventricular complexes  are no longer Present ST no longer elevated in Lateral leads T wave inversion no longer evident in Inferior leads QT has lengthened Confirmed by Zoila Shutter [phone]) on 06/24/2023 2:16:29 PM    ASSESSMENT: Palpitations-occasional PVC's Hyperlipidemia Hypertension-controlled Family history of premature coronary disease CAD s/p CABG x 6 (see anatomy above) - 01/2014  PLAN: 1.   Mr. Alyea says he has no chest pain or worsening shortness of breath.  He reports his PVCs have resolved.  His lipids are decent however LDL still remains above 70 on high-dose statin and ezetimibe.  Advised to work on diet and weight loss more aggressively over the next 3 to 4 months and will repeat lipids as well as an LP(a).  If it remains elevated, would consider PCSK9 inhibitor therapy.  Follow-up with me in 6 months or sooner as necessary.  Chrystie Nose, MD, [facility], FACP    [facility]  Medical Director of the [facility] Lipid [facility] &  Cardiovascular Risk Reduction Clinic Diplomate of the American Board of Clinical Lipidology Attending Cardiologist  Direct Dial: [phone]  Fax: [phone]  Website:  [email or web address]   Lisette Abu Zamzam Whinery 06/24/2023, 2:15 PM

## 2023-06-24 NOTE — Patient Instructions (Signed)
Medication Instructions:  NO CHANGES  *If you need a refill on your cardiac medications before your next appointment, please call your pharmacy*   Lab Work: FASTING labs in 3-4 months  If you have labs (blood work) drawn today and your tests are completely normal, you will receive your results only by: MyChart Message (if you have MyChart) OR A paper copy in the mail If you have any lab test that is abnormal or we need to change your treatment, we will call you to review the results.    Follow-Up: At [facility], you and your health needs are our priority.  As part of our continuing mission to provide you with exceptional heart care, we have created designated Provider Care Teams.  These Care Teams include your primary Cardiologist (physician) and Advanced Practice Providers (APPs -  Physician Assistants and Nurse Practitioners) who all work together to provide you with the care you need, when you need it.  We recommend signing up for the patient portal called "MyChart".  Sign up information is provided on this After Visit Summary.  MyChart is used to connect with patients for Virtual Visits (Telemedicine).  Patients are able to view lab/test results, encounter notes, upcoming appointments, etc.  Non-urgent messages can be sent to your provider as well.   To learn more about what you can do with MyChart, go to [email or web address].    Your next appointment:    6-7 months with Dr. Rennis Golden

## 2023-07-28 ENCOUNTER — Other Ambulatory Visit: Payer: Self-pay | Admitting: Internal Medicine

## 2023-08-12 ENCOUNTER — Telehealth: Payer: Self-pay | Admitting: Internal Medicine

## 2023-08-12 NOTE — Telephone Encounter (Signed)
Patient called and said that he would like for his patient information to be sent over [facility]. There fax number is [phone].

## 2023-08-12 NOTE — Telephone Encounter (Signed)
Patient called to have medical records released to [facility]. He is aeare he will need to fill out a medical release form.

## 2023-09-03 ENCOUNTER — Other Ambulatory Visit: Payer: Self-pay | Admitting: Internal Medicine

## 2023-09-08 DIAGNOSIS — Z131 Encounter for screening for diabetes mellitus: Secondary | ICD-10-CM | POA: Diagnosis not present

## 2023-09-08 DIAGNOSIS — L989 Disorder of the skin and subcutaneous tissue, unspecified: Secondary | ICD-10-CM | POA: Diagnosis not present

## 2023-09-08 DIAGNOSIS — N529 Male erectile dysfunction, unspecified: Secondary | ICD-10-CM | POA: Diagnosis not present

## 2023-09-08 DIAGNOSIS — R030 Elevated blood-pressure reading, without diagnosis of hypertension: Secondary | ICD-10-CM | POA: Diagnosis not present

## 2023-09-08 DIAGNOSIS — Z6831 Body mass index (BMI) 31.0-31.9, adult: Secondary | ICD-10-CM | POA: Diagnosis not present

## 2023-09-08 DIAGNOSIS — E6609 Other obesity due to excess calories: Secondary | ICD-10-CM | POA: Diagnosis not present

## 2023-09-08 DIAGNOSIS — R7309 Other abnormal glucose: Secondary | ICD-10-CM | POA: Diagnosis not present

## 2023-09-24 DIAGNOSIS — Z6831 Body mass index (BMI) 31.0-31.9, adult: Secondary | ICD-10-CM | POA: Diagnosis not present

## 2023-09-24 DIAGNOSIS — I10 Essential (primary) hypertension: Secondary | ICD-10-CM | POA: Diagnosis not present

## 2023-09-24 DIAGNOSIS — E559 Vitamin D deficiency, unspecified: Secondary | ICD-10-CM | POA: Diagnosis not present

## 2023-09-24 DIAGNOSIS — E789 Disorder of lipoprotein metabolism, unspecified: Secondary | ICD-10-CM | POA: Diagnosis not present

## 2023-09-24 DIAGNOSIS — Z1159 Encounter for screening for other viral diseases: Secondary | ICD-10-CM | POA: Diagnosis not present

## 2023-09-24 DIAGNOSIS — Z0000 Encounter for general adult medical examination without abnormal findings: Secondary | ICD-10-CM | POA: Diagnosis not present

## 2023-09-24 DIAGNOSIS — R0602 Shortness of breath: Secondary | ICD-10-CM | POA: Diagnosis not present

## 2023-09-24 DIAGNOSIS — R5383 Other fatigue: Secondary | ICD-10-CM | POA: Diagnosis not present

## 2023-09-24 DIAGNOSIS — Z1339 Encounter for screening examination for other mental health and behavioral disorders: Secondary | ICD-10-CM | POA: Diagnosis not present

## 2023-09-24 DIAGNOSIS — Z13220 Encounter for screening for lipoid disorders: Secondary | ICD-10-CM | POA: Diagnosis not present

## 2023-09-24 DIAGNOSIS — R7303 Prediabetes: Secondary | ICD-10-CM | POA: Diagnosis not present

## 2023-09-24 DIAGNOSIS — Z125 Encounter for screening for malignant neoplasm of prostate: Secondary | ICD-10-CM | POA: Diagnosis not present

## 2023-09-24 DIAGNOSIS — N529 Male erectile dysfunction, unspecified: Secondary | ICD-10-CM | POA: Diagnosis not present

## 2023-09-24 DIAGNOSIS — Z1331 Encounter for screening for depression: Secondary | ICD-10-CM | POA: Diagnosis not present

## 2023-09-24 DIAGNOSIS — Z114 Encounter for screening for human immunodeficiency virus [HIV]: Secondary | ICD-10-CM | POA: Diagnosis not present

## 2023-10-06 DIAGNOSIS — E782 Mixed hyperlipidemia: Secondary | ICD-10-CM | POA: Diagnosis not present

## 2023-10-08 DIAGNOSIS — R030 Elevated blood-pressure reading, without diagnosis of hypertension: Secondary | ICD-10-CM | POA: Diagnosis not present

## 2023-10-08 DIAGNOSIS — I10 Essential (primary) hypertension: Secondary | ICD-10-CM | POA: Diagnosis not present

## 2023-10-08 DIAGNOSIS — E789 Disorder of lipoprotein metabolism, unspecified: Secondary | ICD-10-CM | POA: Diagnosis not present

## 2023-10-08 DIAGNOSIS — R7303 Prediabetes: Secondary | ICD-10-CM | POA: Diagnosis not present

## 2023-10-08 DIAGNOSIS — R748 Abnormal levels of other serum enzymes: Secondary | ICD-10-CM | POA: Diagnosis not present

## 2023-10-08 LAB — LIPID PANEL
Chol/HDL Ratio: 3.3 {ratio} (ref 0.0–5.0)
Cholesterol, Total: 128 mg/dL (ref 100–199)
HDL: 39 mg/dL — ABNORMAL LOW (ref >39)
LDL Chol Calc (NIH): 76 mg/dL (ref 0–99)
Triglycerides: 60 mg/dL (ref 0–149)
VLDL Cholesterol Cal: 13 mg/dL (ref 5–40)

## 2023-10-08 LAB — LIPOPROTEIN A (LPA): Lipoprotein (a): 121.7 nmol/L — ABNORMAL HIGH (ref <75.0)

## 2023-10-13 ENCOUNTER — Encounter: Payer: Self-pay | Admitting: Internal Medicine

## 2024-01-15 ENCOUNTER — Ambulatory Visit: Payer: Medicare Other | Attending: Internal Medicine | Admitting: Internal Medicine

## 2024-01-15 ENCOUNTER — Encounter: Payer: Self-pay | Admitting: Internal Medicine

## 2024-01-15 ENCOUNTER — Other Ambulatory Visit: Payer: Self-pay | Admitting: Internal Medicine

## 2024-01-15 VITALS — BP 124/70 | HR 52 | Ht 72.0 in | Wt 236.0 lb

## 2024-01-15 DIAGNOSIS — Z951 Presence of aortocoronary bypass graft: Secondary | ICD-10-CM

## 2024-01-15 DIAGNOSIS — I10 Essential (primary) hypertension: Secondary | ICD-10-CM

## 2024-01-15 DIAGNOSIS — E785 Hyperlipidemia, unspecified: Secondary | ICD-10-CM | POA: Diagnosis not present

## 2024-01-15 DIAGNOSIS — I2510 Atherosclerotic heart disease of native coronary artery without angina pectoris: Secondary | ICD-10-CM | POA: Diagnosis not present

## 2024-01-15 MED ORDER — ATORVASTATIN CALCIUM 80 MG PO TABS: ORAL_TABLET | ORAL | 3 refills | Status: DC

## 2024-01-15 MED ORDER — METOPROLOL SUCCINATE ER 25 MG PO TB24: 12.5000 mg | ORAL_TABLET | Freq: Every day | ORAL | 1 refills | Status: DC

## 2024-01-15 MED ORDER — EZETIMIBE 10 MG PO TABS: 10.0000 mg | ORAL_TABLET | Freq: Every day | ORAL | 1 refills | Status: DC

## 2024-01-15 NOTE — Patient Instructions (Signed)
Medication Instructions:  Your physician recommends that you continue on your current medications as directed. Please refer to the Current Medication list given to you today.    *If you need a refill on your cardiac medications before your next appointment, please call your pharmacy*   Lab Work: None    If you have labs (blood work) drawn today and your tests are completely normal, you will receive your results only by: MyChart Message (if you have MyChart) OR A paper copy in the mail If you have any lab test that is abnormal or we need to change your treatment, we will call you to review the results.   Testing/Procedures: None    Follow-Up: At [facility], you and your health needs are our priority.  As part of our continuing mission to provide you with exceptional heart care, we have created designated Provider Care Teams.  These Care Teams include your primary Cardiologist (physician) and Advanced Practice Providers (APPs -  Physician Assistants and Nurse Practitioners) who all work together to provide you with the care you need, when you need it.  We recommend signing up for the patient portal called "MyChart".  Sign up information is provided on this After Visit Summary.  MyChart is used to connect with patients for Virtual Visits (Telemedicine).  Patients are able to view lab/test results, encounter notes, upcoming appointments, etc.  Non-urgent messages can be sent to your provider as well.   To learn more about what you can do with MyChart, go to [email or web address].    Your next appointment:   1 year(s)  The format for your next appointment:   In Person  Provider:   Chrystie Nose, MD    Other Instructions

## 2024-01-15 NOTE — Progress Notes (Signed)
OFFICE NOTE  Chief Complaint:  No complaints  Primary Care Physician: Knox Royalty, MD  HPI:  [name] West is a very pleasant 73 year old male who's been followed by Dr. Yetta Barre. He recently has been having more frequent palpitations and is interested in a cardiac visit to evaluate these as well as his cardiac risk. He tells me that he was initially diagnosed with abnormally high cholesterol in the 1990s. He was then started on Lipitor and has stayed on that since then. A few years ago he is noted to have onset of hypertension and has had some weight gain. He used to be an avid runner, doing about 4 miles 3 times a week. He did then developed the problems and is not exercising as much. He has had some weight gain, particularly around the neck. His wife, who is a Designer, jewellery, noted that he does snore and may actually sleep in different sides of the house. She reports that he has had some witnessed apneic events in the past, but he is never had a sleep study. With regards to his palpitations, he reports feelings of skipped beats which were interpreted as possible PVCs. While he may be having this, an EKG in the office today did catch a short run of PAT which was 3 beats. I suspect this could be the cause of his palpitations. He is not on a beta blocker. Finally his family history is significant for heart disease both in his father and grandfather. He is asymptomatic, denying any chest pain or shortness of breath with exertion.  [name] West returns today for followup on his cardiac calcium score. The findings indicated a coronary calcium score of 313, which is 93% compared to age and sex-matched controls. He is also here to followup on his sleep study which she underwent on 12/10/2013. This demonstrated severe obstructive sleep apnea. During the total sleep time of 2 hours and 15 minutes, there were 55.6 AHI.  CPAP titration was recommended.  Based upon his frequent PVCs which are  persistent, recommended a nuclear stress test as there is evidence of multivessel coronary calcium. He underwent a nuclear stress test 01/05/2014. This was an exercise nuclear stress test. He exercised for 9 minutes and achieved 10 metabolic equivalents. Exercise was discontinued due to chest pain, shortness of breath and fatigue. There was 2 mm of horizontal ST segment depression in frequent ventricular ectopy and transient bigeminy noted. The nuclear perfusion images were interpreted as high wrist demonstrating an apical scar with peri-infarct ischemia and basal lateral ischemia.  EF was 46%.  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 The LAD filled distally via collaterals. He was admitted and placed on a heparin drip. Cardiothoracic surgical consultation was obtained with Charlett Lango M.D. he evaluated the patient and studies and agree with recommendations to proceed with coronary artery surgical revascularization. The patient remained medically stable and surgery was scheduled. On 02/02/2014 he was taken the operating room and underwent CABGx6 with left internal mammary artery to LAD, free right internal mammary artery to obtuse marginal 1, sequential saphenous vein graft to the second and third diagonals, sequential saphenous vein graft to distal right coronary and posterior descending. Transesophageal echo revealed ejection fraction and normal wall motion.  Today he returns and is  without complaints. Feeling quite well and is recovering quickly.  [name] West returns today and is feeling very well. He completed cardiac rehabilitation successfully and has had no issues. He had one episode of palpitations was doing significant yard work over the summer for several hours which may been related to  dehydration. He has since joined the gym and has been very active exercising 5 days a week. He's had some weight loss and dietary changes. Overall is going in the right direction. I recently added Zetia to his medications and he will be due to have a recheck in 3 months of his cholesterol.  I saw [name] West back in the office today. He had no significant complaints. His EKG was noted to be in sinus bradycardia with a heart rate around 45. At his last office visit the heart rate was in the 40s. He says when he goes to gym generally his heart rates around the 60s and is able to get up in the low 100s. Although he is asymptomatic and his blood pressure is normal, I'm concerned about the low rate and that he may be on too much beta blocker. We recently repeated his lipid profile which shows a marked improvement. LDL particle number is now 936, LDL-C is 81, HDL 51, and triglycerides 47. Total cholesterol is 141. This does show optimal control. I also think besides adjustments at his medicine that exercise and weight loss are helping him tremendously.  [name] West returns today for follow-up. He denies any chest pain or worsening shortness of breath. Heart rate remains bradycardic but he is asymptomatic with this. He is doing a little less exercise than he normally does but plans to increase it this summer. Weight is gone up slightly. He had recent lab work which shows a total cholesterol of 155, triglycerides 70, HDL 42, and LDL 99. This is on high-dose atorvastatin and zetia and he has no side effects from these medicines.  02/24/2017  [name] West continues to do well. He is without complaints. Blood pressure is mildly elevated 148/80, recheck came down to 126/78. He denies any chest pain or worsening shortness of breath. He has had a nice improvement in overall cholesterol on high-dose atorvastatin 80 mg and ezetimibe 10 mg daily. Recent cholesterol profile in February demonstrated total cholesterol 135, HDL  39, LDL-C 81, triglycerides 76 and non-HDL cholesterol of 96. Although his non-HDL cholesterol is at goal, LDL-C could be slightly better at less than 70. We discussed possibly adding PC SK 9 inhibitor although his control is very good and he could stand to lose a little weight. That may make a difference with his cholesterol. He wants to try that first and is not interested in additional medication at this time.  03/02/2018  [name] West returns today for follow-up.  Overall he continues to be without complaints.  He does a lot of strenuous yard work and exercise.  He says he can be active for up to 4-5 hours at a time.  He generally takes some breaks.  He denies any chest pain or worsening shortness of breath  Blood pressure is been very well controlled and is 122/60 today.  Recent lipid profile however showed total cholesterol 134, triglycerides 74, HDL 39 and LDL-C of 80.  His LDL is lower than it has been in the past however remains elevated above target of 70.  We talked about aggressive diet and weight loss as previously recommended over the past several years but there have been  no significant changes in that standpoint.  Based on the data with PCS K9 inhibitors, he is an ideal patient for treatment.  His LDL remains above goal and is similar to those patients at baseline who are enrolled in clinical trials.  In addition he has documented multivessel coronary disease with prior CABG.  We discussed the possibility of starting a PCSK9 inhibitor today and he seemed to be interested in it.  07/22/2019  [name] West is seen today in routine annual follow-up.  Overall he continues to do well.  He is asymptomatic denies any chest pain or worsening shortness of breath.  Cholesterol has remained fairly stable with LDL 89.  Is been as low around 80 but is never been below 70.  We previously talked about additional medication since he is maxed out on atorvastatin and ezetimibe.  Also talked about a PCSK9  inhibitor but he was not interested in it.  He felt like he was doing well enough where he is at.  Therefore we will continue his current medicines.  He has no anginal symptoms or worsening shortness of breath.  10/03/2020  [name] West is seen today in follow-up.  He again is doing well.  Denies any chest pain or worsening shortness of breath.  He did have recent lab work which appears mostly unchanged compared to prior studies.  Specifically his total cholesterol was 137, triglycerides 62, HDL 38 and LDL of 86.  We discussed his target LDL 70 and again he did not seem interested in adding any additional therapies to lower cholesterol.  He was willing to continue to try to work on diet and more weight loss.  Trolled today.  EKG was personally reviewed and shows a sinus bradycardia at 53.  01/07/2022  [name] West returns today for follow-up.  This is a routine visit.  He says he is feeling very well.  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  He gets a lot of exercise with this.  He denies any chest pain or shortness of breath.  EKG today shows a sinus bradycardia at 49.  Blood pressure is excellent 120/60.  He just had recent labs including lipid profile showed total cholesterol 126, HDL 39, triglycerides 43 and LDL 77.  06/24/2023  [name] West is seen today for annual follow-up.  Overall he says he is doing pretty well.  Denies any chest pain or shortness of breath.  Interestingly notes that his PVCs have completely gone away.  EKG today confirms that.  He had recent lipid testing which showed total cholesterol 140, triglycerides 56, HDL 46 and LDL 82.  His target LDL is less than 70.  He is also noted a recent increase in glucose on his metabolic profile.  He has not seen a primary care provider for "years".  He is going to establish with Dr. Yetta Barre again and get a hemoglobin A1c and send that to [place].  He would like some dietary tips to try to  lower cholesterol further and I would advise checking an LP(a) as he might be a candidate for PCSK9 inhibitor therapy.  Other than that he asked today whether or not it would be safe to use PDE 5 inhibitor such as Viagra and that would be okay in my opinion.  He is not on a nitrate.  01/15/2024  [name] West returns today for follow-up.  Overall he says he is feeling well.  Denies any chest pain or worsening  shortness of breath.  He had stopped working [place] the [place] due to some issues with his knees on the concrete.  He may start working again in the future.  Blood pressure is well-controlled today.  He had lipid testing in October showed an LDL of 76, total cholesterol 128, HDL 39 and triglycerides 60.  He is transitioning to a new pharmacy and we will provide refills for him today.  He maintains on aspirin, atorvastatin, ezetimibe and metoprolol.  PMHx:  Past Medical History:  Diagnosis Date   Hyperlipidemia    Hypertension    Palpitations     Past Surgical History:  Procedure Laterality Date   CORONARY ARTERY BYPASS GRAFT N/A 02/02/2014   Procedure: CORONARY ARTERY BYPASS GRAFTING (CABG) times six using bilateral mammaries and right saphenous vein.;  Surgeon: Loreli Slot, MD;  Location: [facility] OR;  Service: Open Heart Surgery;  Laterality: N/A;   INTRAOPERATIVE TRANSESOPHAGEAL ECHOCARDIOGRAM N/A 02/02/2014   Procedure: INTRAOPERATIVE TRANSESOPHAGEAL ECHOCARDIOGRAM;  Surgeon: Loreli Slot, MD;  Location: [facility] OR;  Service: Open Heart Surgery;  Laterality: N/A;   LEFT HEART CATHETERIZATION WITH CORONARY ANGIOGRAM N/A 02/01/2014   Procedure: LEFT HEART CATHETERIZATION WITH CORONARY ANGIOGRAM;  Surgeon: Chrystie Nose, MD;  Location: [facility] CATH LAB;  Service: Cardiovascular;  Laterality: N/A;    FAMHx:  Family History  Problem Relation Age of Onset   Cancer Mother    Heart disease Father        MI at 56, CABG at 70, pacemaker at 57   Heart attack Paternal Grandfather 46    Heart failure Maternal Grandfather 10    SOCHx:   reports that he quit smoking about 50 years ago. His smoking use included cigarettes. He has never used smokeless tobacco. He reports current alcohol use of about 2.0 - 3.0 standard drinks of alcohol per week. He reports that he does not use drugs.  ALLERGIES:  No Known Allergies  ROS: Pertinent items noted in HPI and remainder of comprehensive ROS otherwise negative.  HOME MEDS: Current Outpatient Medications  Medication Sig Dispense Refill   aspirin EC 81 MG tablet Take 81 mg by mouth daily.     Cholecalciferol (VITAMIN D-3) 1000 UNITS CAPS Take 1,000 Units by mouth daily.      ibuprofen (ADVIL,MOTRIN) 200 MG tablet Take 200 mg by mouth every 6 (six) hours as needed (muscle pain).     Multiple Vitamin (MULTIVITAMIN) capsule Take 1 capsule by mouth daily. With A, C, E     Omega-3 Fatty Acids (FISH OIL) 1200 MG CAPS Take 1,200 mg by mouth daily.      traMADol (ULTRAM) 50 MG tablet Take 1 tablet (50 mg total) by mouth every 8 (eight) hours as needed for moderate pain. 30 tablet 0   atorvastatin (LIPITOR) 80 MG tablet TAKE 1 TABLET BY MOUTH EVERY DAY AT 6 PM 90 tablet 3   ezetimibe (ZETIA) 10 MG tablet Take 1 tablet (10 mg total) by mouth daily. 90 tablet 1   metoprolol succinate (TOPROL-XL) 25 MG 24 hr tablet Take 0.5 tablets (12.5 mg total) by mouth daily. 45 tablet 1   No current facility-administered medications for this visit.    LABS/IMAGING: No results found for this or any previous visit (from the past 48 hours). No results found.  VITALS: BP 124/70 (BP Location: Left Arm, Patient Position: Sitting)   Pulse (!) 52   Ht 6' (1.829 m)   Wt 236 lb (107 kg)   SpO2 Marland Kitchen)  87%   BMI 32.01 kg/m   EXAM: General appearance: alert and no distress Neck: no carotid bruit, no JVD and thyroid not enlarged, symmetric, no tenderness/mass/nodules Lungs: clear to auscultation bilaterally Heart: regular rate and rhythm Abdomen: soft,  non-tender; bowel sounds normal; no masses,  no organomegaly and obese Extremities: extremities normal, atraumatic, no cyanosis or edema Pulses: 2+ and symmetric Skin: Skin color, texture, turgor normal. No rashes or lesions Neurologic: Grossly normal Psych: Pleasant  EKG: EKG Interpretation Date/Time:  Thursday January 15 2024 09:50:01 EST Ventricular Rate:  52 PR Interval:  198 QRS Duration:  100 QT Interval:  430 QTC Calculation: 399 R Axis:   5  Text Interpretation: Sinus bradycardia with occasional Premature ventricular complexes and Premature atrial complexes Low voltage QRS Nonspecific ST abnormality When compared with ECG of 24-Jun-2023 14:07, Premature ventricular complexes are now Present Premature atrial complexes are now Present Confirmed by Zoila Shutter [phone]) on 01/15/2024 10:01:00 AM   ASSESSMENT: Palpitations-occasional PVC's, PAC's, asymptomatic Hyperlipidemia Hypertension-controlled Family history of premature coronary disease CAD s/p CABG x 6 (see anatomy above) - 01/2014  PLAN: 1.   Mr. Stall continues to do well without any specific complaints.  He did stop [place] but he is hopefully going to remain active.  Cholesterol is relatively well-controlled.  His blood pressure is at goal.  He denies any chest pain or worsening shortness of breath.  EKG showed some PVCs and PACs but is relatively asymptomatic with this.  Follow-up with me annually or sooner as necessary.  Chrystie Nose, MD, [facility], FACP  [place]  [facility]  Medical Director of the [facility] Lipid [facility] &  Cardiovascular Risk Reduction Clinic Diplomate of the American Board of Clinical Lipidology Attending Cardiologist  Direct Dial: [phone]  Fax: [phone]  Website:  [email or web address]   Lisette Abu Rollin Kotowski 01/15/2024, 10:14 AM

## 2024-02-26 ENCOUNTER — Other Ambulatory Visit: Payer: Self-pay | Admitting: Internal Medicine

## 2024-03-18 DIAGNOSIS — H524 Presbyopia: Secondary | ICD-10-CM | POA: Diagnosis not present

## 2024-05-11 DIAGNOSIS — Z1283 Encounter for screening for malignant neoplasm of skin: Secondary | ICD-10-CM | POA: Diagnosis not present

## 2024-05-11 DIAGNOSIS — B078 Other viral warts: Secondary | ICD-10-CM | POA: Diagnosis not present

## 2024-05-11 DIAGNOSIS — D225 Melanocytic nevi of trunk: Secondary | ICD-10-CM | POA: Diagnosis not present

## 2024-05-11 DIAGNOSIS — L821 Other seborrheic keratosis: Secondary | ICD-10-CM | POA: Diagnosis not present

## 2024-07-14 DIAGNOSIS — K080 Exfoliation of teeth due to systemic causes: Secondary | ICD-10-CM | POA: Diagnosis not present

## 2024-07-21 ENCOUNTER — Other Ambulatory Visit: Payer: Self-pay | Admitting: Internal Medicine

## 2024-07-23 ENCOUNTER — Telehealth: Payer: Self-pay | Admitting: Internal Medicine

## 2024-07-23 NOTE — Telephone Encounter (Signed)
Refill already responded to

## 2024-07-23 NOTE — Telephone Encounter (Signed)
*  STAT* If patient is at the pharmacy, call can be transferred to refill team.   1. Which medications need to be refilled? (please list name of each medication and dose if known)   ezetimibe  (ZETIA ) 10 MG tablet   2. Would you like to learn more about the convenience, safety, & potential cost savings by using the [facility]?   3. Are you open to using the [facility] (Type [facility]. ).  4. Which pharmacy/location (including street and city if local pharmacy) is medication to be sent to?  [id number] - [place], [place] - [address] AT [facility] [address]   5. Do they need a 30 day or 90 day supply?   90 day  Patient stated he has some medication left.

## 2024-09-01 ENCOUNTER — Other Ambulatory Visit: Payer: Self-pay | Admitting: Internal Medicine

## 2024-11-29 ENCOUNTER — Other Ambulatory Visit: Payer: Self-pay | Admitting: Internal Medicine

## 2025-01-04 ENCOUNTER — Other Ambulatory Visit: Payer: Self-pay | Admitting: Internal Medicine

## 2025-01-05 NOTE — Telephone Encounter (Signed)
 In accordance with refill protocols, please review and address the following requirements before this medication refill can be authorized:  Labs

## 2025-01-05 NOTE — Telephone Encounter (Signed)
 Needs appt

## 2025-01-11 ENCOUNTER — Telehealth: Payer: Self-pay | Admitting: Internal Medicine

## 2025-01-11 DIAGNOSIS — E785 Hyperlipidemia, unspecified: Secondary | ICD-10-CM

## 2025-01-11 NOTE — Telephone Encounter (Signed)
 Pt requesting to have blood work done before his upcoming appt.

## 2025-01-12 NOTE — Addendum Note (Signed)
 Addended by: LORING ANDRIETTE HERO on: 01/12/2025 09:15 AM   Modules accepted: Orders

## 2025-01-12 NOTE — Telephone Encounter (Signed)
 Left message for patient on name-verified VM that MD has ordered a fasting lipid panel and to complete at least 3 days before OV.

## 2025-02-09 ENCOUNTER — Ambulatory Visit: Admitting: Internal Medicine
